# Patient Record
Sex: Male | Born: 1950 | Race: Black or African American | Hispanic: No | Marital: Married | Smoking: Never smoker
Health system: Southern US, Community
[De-identification: ages and names within clinical notes are randomized; demographics above are authoritative.]

## PROBLEM LIST (undated history)

## (undated) DIAGNOSIS — H548 Legal blindness, as defined in USA: Secondary | ICD-10-CM

## (undated) DIAGNOSIS — D649 Anemia, unspecified: Secondary | ICD-10-CM

## (undated) DIAGNOSIS — E119 Type 2 diabetes mellitus without complications: Secondary | ICD-10-CM

## (undated) DIAGNOSIS — C6290 Malignant neoplasm of unspecified testis, unspecified whether descended or undescended: Secondary | ICD-10-CM

## (undated) DIAGNOSIS — E7800 Pure hypercholesterolemia, unspecified: Secondary | ICD-10-CM

## (undated) DIAGNOSIS — I10 Essential (primary) hypertension: Secondary | ICD-10-CM

## (undated) DIAGNOSIS — K219 Gastro-esophageal reflux disease without esophagitis: Secondary | ICD-10-CM

## (undated) DIAGNOSIS — Z87442 Personal history of urinary calculi: Secondary | ICD-10-CM

## (undated) DIAGNOSIS — H409 Unspecified glaucoma: Secondary | ICD-10-CM

## (undated) HISTORY — PX: OTHER SURGICAL HISTORY: SHX169

## (undated) HISTORY — DX: Gastro-esophageal reflux disease without esophagitis: K21.9

## (undated) HISTORY — PX: BRAIN SURGERY: SHX531

## (undated) HISTORY — PX: SUBDURAL HEMATOMA EVACUATION VIA CRANIOTOMY: SUR319

## (undated) HISTORY — DX: Personal history of urinary calculi: Z87.442

## (undated) HISTORY — PX: EYE SURGERY: SHX253

---

## 2004-01-16 ENCOUNTER — Ambulatory Visit: Payer: Self-pay | Admitting: Gastroenterology

## 2006-06-14 ENCOUNTER — Other Ambulatory Visit: Payer: Self-pay

## 2006-06-14 ENCOUNTER — Emergency Department: Payer: Self-pay | Admitting: Emergency Medicine

## 2007-08-10 ENCOUNTER — Other Ambulatory Visit: Payer: Self-pay

## 2007-08-10 ENCOUNTER — Inpatient Hospital Stay: Payer: Self-pay | Admitting: Internal Medicine

## 2010-03-23 ENCOUNTER — Inpatient Hospital Stay: Payer: Self-pay | Admitting: Surgery

## 2010-03-30 LAB — PATHOLOGY REPORT

## 2010-04-23 ENCOUNTER — Ambulatory Visit: Payer: Self-pay | Admitting: Internal Medicine

## 2010-05-05 ENCOUNTER — Ambulatory Visit: Payer: Self-pay | Admitting: Internal Medicine

## 2010-06-05 ENCOUNTER — Ambulatory Visit: Payer: Self-pay | Admitting: Internal Medicine

## 2010-09-15 ENCOUNTER — Ambulatory Visit: Payer: Self-pay | Admitting: Internal Medicine

## 2010-10-05 ENCOUNTER — Ambulatory Visit: Payer: Self-pay | Admitting: Internal Medicine

## 2011-01-21 ENCOUNTER — Ambulatory Visit: Payer: Self-pay | Admitting: Internal Medicine

## 2011-01-21 LAB — CBC CANCER CENTER
Basophil #: 0 x10 3/mm (ref 0.0–0.1)
Basophil %: 0.7 %
Eosinophil #: 0.1 x10 3/mm (ref 0.0–0.7)
Eosinophil %: 1.4 %
HGB: 13 g/dL (ref 13.0–18.0)
Lymphocyte %: 23.7 %
MCH: 30.6 pg (ref 26.0–34.0)
MCHC: 32.6 g/dL (ref 32.0–36.0)
MCV: 94 fL (ref 80–100)
Monocyte #: 0.5 x10 3/mm (ref 0.0–0.7)
Monocyte %: 7.8 %
Neutrophil #: 4.3 x10 3/mm (ref 1.4–6.5)
Neutrophil %: 66.4 %
RBC: 4.25 10*6/uL — ABNORMAL LOW (ref 4.40–5.90)
RDW: 13.4 % (ref 11.5–14.5)
WBC: 6.4 x10 3/mm (ref 3.8–10.6)

## 2011-01-21 LAB — HEPATIC FUNCTION PANEL A (ARMC)
Albumin: 3.9 g/dL (ref 3.4–5.0)
Alkaline Phosphatase: 81 U/L (ref 50–136)
Bilirubin, Direct: 0.1 mg/dL (ref 0.00–0.20)
Bilirubin,Total: 0.6 mg/dL (ref 0.2–1.0)
SGPT (ALT): 35 U/L

## 2011-01-21 LAB — CREATININE, SERUM
Creatinine: 1.09 mg/dL (ref 0.60–1.30)
EGFR (African American): >60
EGFR (Non-African Amer.): >60

## 2011-02-05 ENCOUNTER — Ambulatory Visit: Payer: Self-pay | Admitting: Internal Medicine

## 2011-05-27 ENCOUNTER — Ambulatory Visit: Payer: Self-pay | Admitting: Internal Medicine

## 2011-05-27 LAB — CBC CANCER CENTER
Basophil #: 0 x10 3/mm (ref 0.0–0.1)
Basophil %: 0.5 %
Eosinophil #: 0.1 x10 3/mm (ref 0.0–0.7)
Eosinophil %: 0.8 %
HCT: 39.1 % — ABNORMAL LOW (ref 40.0–52.0)
HGB: 12.9 g/dL — ABNORMAL LOW (ref 13.0–18.0)
Lymphocyte #: 1.7 x10 3/mm (ref 1.0–3.6)
Lymphocyte %: 23.7 %
MCH: 30.6 pg (ref 26.0–34.0)
Monocyte #: 0.5 x10 3/mm (ref 0.2–1.0)
Monocyte %: 7.2 %
Neutrophil #: 5 x10 3/mm (ref 1.4–6.5)
Platelet: 229 x10 3/mm (ref 150–440)
RBC: 4.2 10*6/uL — ABNORMAL LOW (ref 4.40–5.90)

## 2011-05-27 LAB — HEPATIC FUNCTION PANEL A (ARMC)
Albumin: 3.9 g/dL (ref 3.4–5.0)
Alkaline Phosphatase: 68 U/L (ref 50–136)
Bilirubin, Direct: 0.2 mg/dL (ref 0.00–0.20)
Bilirubin,Total: 0.9 mg/dL (ref 0.2–1.0)
SGOT(AST): 18 U/L (ref 15–37)
SGPT (ALT): 35 U/L
Total Protein: 7.5 g/dL (ref 6.4–8.2)

## 2011-05-27 LAB — CREATININE, SERUM: Creatinine: 1 mg/dL (ref 0.60–1.30)

## 2011-06-05 ENCOUNTER — Ambulatory Visit: Payer: Self-pay | Admitting: Internal Medicine

## 2011-08-12 DIAGNOSIS — H2513 Age-related nuclear cataract, bilateral: Secondary | ICD-10-CM | POA: Insufficient documentation

## 2011-08-12 DIAGNOSIS — E1165 Type 2 diabetes mellitus with hyperglycemia: Secondary | ICD-10-CM | POA: Insufficient documentation

## 2011-08-12 DIAGNOSIS — H401190 Primary open-angle glaucoma, unspecified eye, stage unspecified: Secondary | ICD-10-CM | POA: Insufficient documentation

## 2011-08-12 DIAGNOSIS — H269 Unspecified cataract: Secondary | ICD-10-CM | POA: Insufficient documentation

## 2011-08-12 DIAGNOSIS — H409 Unspecified glaucoma: Secondary | ICD-10-CM | POA: Insufficient documentation

## 2011-08-12 DIAGNOSIS — I10 Essential (primary) hypertension: Secondary | ICD-10-CM | POA: Insufficient documentation

## 2011-08-12 DIAGNOSIS — H04129 Dry eye syndrome of unspecified lacrimal gland: Secondary | ICD-10-CM | POA: Insufficient documentation

## 2011-08-12 DIAGNOSIS — E1169 Type 2 diabetes mellitus with other specified complication: Secondary | ICD-10-CM | POA: Insufficient documentation

## 2012-02-03 ENCOUNTER — Ambulatory Visit: Payer: Self-pay | Admitting: Internal Medicine

## 2012-02-03 LAB — CBC CANCER CENTER
Basophil #: 0 x10 3/mm (ref 0.0–0.1)
Eosinophil #: 0.1 x10 3/mm (ref 0.0–0.7)
Eosinophil %: 1.4 %
MCH: 30.1 pg (ref 26.0–34.0)
MCHC: 33 g/dL (ref 32.0–36.0)
Monocyte #: 0.6 x10 3/mm (ref 0.2–1.0)
Monocyte %: 9 %
Neutrophil #: 4.4 x10 3/mm (ref 1.4–6.5)
Neutrophil %: 63.8 %
RBC: 4.47 10*6/uL (ref 4.40–5.90)
RDW: 14.2 % (ref 11.5–14.5)
WBC: 6.9 x10 3/mm (ref 3.8–10.6)

## 2012-02-03 LAB — HEPATIC FUNCTION PANEL A (ARMC)
Albumin: 4 g/dL (ref 3.4–5.0)
Alkaline Phosphatase: 66 U/L (ref 50–136)
Bilirubin,Total: 0.6 mg/dL (ref 0.2–1.0)
SGPT (ALT): 30 U/L (ref 12–78)

## 2012-02-03 LAB — CREATININE, SERUM
Creatinine: 1.09 mg/dL (ref 0.60–1.30)
EGFR (Non-African Amer.): >60

## 2012-02-05 ENCOUNTER — Ambulatory Visit: Payer: Self-pay | Admitting: Internal Medicine

## 2012-03-04 ENCOUNTER — Ambulatory Visit: Payer: Self-pay | Admitting: Internal Medicine

## 2012-04-04 ENCOUNTER — Ambulatory Visit: Payer: Self-pay | Admitting: Internal Medicine

## 2013-03-08 ENCOUNTER — Ambulatory Visit: Payer: Self-pay | Admitting: Internal Medicine

## 2013-03-08 LAB — CREATININE, SERUM
Creatinine: 1.03 mg/dL (ref 0.60–1.30)
EGFR (Non-African Amer.): >60

## 2013-03-08 LAB — CBC CANCER CENTER
BASOS PCT: 0.7 %
Basophil #: 0 x10 3/mm (ref 0.0–0.1)
EOS PCT: 1.8 %
Eosinophil #: 0.1 x10 3/mm (ref 0.0–0.7)
HCT: 40 % (ref 40.0–52.0)
HGB: 12.9 g/dL — ABNORMAL LOW (ref 13.0–18.0)
Lymphocyte #: 1.5 x10 3/mm (ref 1.0–3.6)
Lymphocyte %: 22 %
MCH: 30 pg (ref 26.0–34.0)
MCHC: 32.3 g/dL (ref 32.0–36.0)
MCV: 93 fL (ref 80–100)
MONO ABS: 0.5 x10 3/mm (ref 0.2–1.0)
Monocyte %: 6.9 %
Neutrophil #: 4.6 x10 3/mm (ref 1.4–6.5)
Neutrophil %: 68.6 %
PLATELETS: 274 x10 3/mm (ref 150–440)
RBC: 4.31 10*6/uL — AB (ref 4.40–5.90)
RDW: 14.4 % (ref 11.5–14.5)
WBC: 6.7 x10 3/mm (ref 3.8–10.6)

## 2013-03-08 LAB — HEPATIC FUNCTION PANEL A (ARMC)
ALK PHOS: 73 U/L
Albumin: 4 g/dL (ref 3.4–5.0)
Bilirubin, Direct: 0.2 mg/dL (ref 0.00–0.20)
Bilirubin,Total: 0.6 mg/dL (ref 0.2–1.0)
SGOT(AST): 14 U/L — ABNORMAL LOW (ref 15–37)
SGPT (ALT): 28 U/L (ref 12–78)
Total Protein: 7.8 g/dL (ref 6.4–8.2)

## 2013-04-04 ENCOUNTER — Ambulatory Visit: Payer: Self-pay | Admitting: Internal Medicine

## 2013-06-13 DIAGNOSIS — E7800 Pure hypercholesterolemia, unspecified: Secondary | ICD-10-CM | POA: Insufficient documentation

## 2013-09-13 ENCOUNTER — Ambulatory Visit: Payer: Self-pay | Admitting: Internal Medicine

## 2014-05-20 ENCOUNTER — Emergency Department: Payer: Commercial Managed Care - HMO

## 2014-05-20 ENCOUNTER — Encounter: Payer: Self-pay | Admitting: Emergency Medicine

## 2014-05-20 ENCOUNTER — Emergency Department
Admission: EM | Admit: 2014-05-20 | Discharge: 2014-05-20 | Disposition: A | Discharge status: Home / Self Care | Payer: Commercial Managed Care - HMO | Attending: Emergency Medicine | Admitting: Emergency Medicine

## 2014-05-20 DIAGNOSIS — Y92410 Unspecified street and highway as the place of occurrence of the external cause: Secondary | ICD-10-CM | POA: Diagnosis not present

## 2014-05-20 DIAGNOSIS — Y998 Other external cause status: Secondary | ICD-10-CM | POA: Insufficient documentation

## 2014-05-20 DIAGNOSIS — E119 Type 2 diabetes mellitus without complications: Secondary | ICD-10-CM | POA: Diagnosis not present

## 2014-05-20 DIAGNOSIS — S161XXA Strain of muscle, fascia and tendon at neck level, initial encounter: Secondary | ICD-10-CM | POA: Insufficient documentation

## 2014-05-20 DIAGNOSIS — I10 Essential (primary) hypertension: Secondary | ICD-10-CM | POA: Diagnosis not present

## 2014-05-20 DIAGNOSIS — Y9389 Activity, other specified: Secondary | ICD-10-CM | POA: Insufficient documentation

## 2014-05-20 DIAGNOSIS — S199XXA Unspecified injury of neck, initial encounter: Secondary | ICD-10-CM | POA: Diagnosis present

## 2014-05-20 HISTORY — DX: Unspecified glaucoma: H40.9

## 2014-05-20 HISTORY — DX: Type 2 diabetes mellitus without complications: E11.9

## 2014-05-20 HISTORY — DX: Essential (primary) hypertension: I10

## 2014-05-20 HISTORY — DX: Malignant neoplasm of unspecified testis, unspecified whether descended or undescended: C62.90

## 2014-05-20 HISTORY — DX: Legal blindness, as defined in USA: H54.8

## 2014-05-20 HISTORY — DX: Pure hypercholesterolemia, unspecified: E78.00

## 2014-05-20 LAB — GLUCOSE, CAPILLARY: Glucose-Capillary: 139 mg/dL — ABNORMAL HIGH (ref 65–99)

## 2014-05-20 MED ORDER — HYDROCODONE-ACETAMINOPHEN 5-325 MG PO TABS: 1.0000 | ORAL_TABLET | ORAL | Status: DC | PRN
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
## 2014-05-20 NOTE — Discharge Instructions (Signed)
Cervical Sprain A cervical sprain is when the tissues (ligaments) that hold the neck bones in place stretch or tear. HOME CARE   Put ice on the injured area.  Put ice in a plastic bag.  Place a towel between your skin and the bag.  Leave the ice on for 15-20 minutes, 3-4 times a day.  You may have been given a collar to wear. This collar keeps your neck from moving while you heal.  Do not take the collar off unless told by your doctor.  If you have long hair, keep it outside of the collar.  Ask your doctor before changing the position of your collar. You may need to change its position over time to make it more comfortable.  If you are allowed to take off the collar for cleaning or bathing, follow your doctor's instructions on how to do it safely.  Keep your collar clean by wiping it with mild soap and water. Dry it completely. If the collar has removable pads, remove them every 1-2 days to hand wash them with soap and water. Allow them to air dry. They should be dry before you wear them in the collar.  Do not drive while wearing the collar.  Only take medicine as told by your doctor.  Keep all doctor visits as told.  Keep all physical therapy visits as told.  Adjust your work station so that you have good posture while you work.  Avoid positions and activities that make your problems worse.  Warm up and stretch before being active. GET HELP IF:  Your pain is not controlled with medicine.  You cannot take less pain medicine over time as planned.  Your activity level does not improve as expected. GET HELP RIGHT AWAY IF:   You are bleeding.  Your stomach is upset.  You have an allergic reaction to your medicine.  You develop new problems that you cannot explain.  You lose feeling (become numb) or you cannot move any part of your body (paralysis).  You have tingling or weakness in any part of your body.  Your symptoms get worse. Symptoms include:  Pain,  soreness, stiffness, puffiness (swelling), or a burning feeling in your neck.  Pain when your neck is touched.  Shoulder or upper back pain.  Limited ability to move your neck.  Headache.  Dizziness.  Your hands or arms feel week, lose feeling, or tingle.  Muscle spasms.  Difficulty swallowing or chewing. MAKE SURE YOU:   Understand these instructions.  Will watch your condition.  Will get help right away if you are not doing well or get worse. Document Released: 06/09/2007 Document Revised: 08/23/2012 Document Reviewed: 06/28/2012 ExitCare Patient Information 2015 [employer]. This information is not intended to replace advice given to you by your health care provider. Make sure you discuss any questions you have with your health care provider.  

## 2015-06-30 DIAGNOSIS — Z862 Personal history of diseases of the blood and blood-forming organs and certain disorders involving the immune mechanism: Secondary | ICD-10-CM | POA: Insufficient documentation

## 2015-06-30 DIAGNOSIS — Z7189 Other specified counseling: Secondary | ICD-10-CM | POA: Insufficient documentation

## 2015-06-30 DIAGNOSIS — Z7185 Encounter for immunization safety counseling: Secondary | ICD-10-CM | POA: Insufficient documentation

## 2016-04-12 ENCOUNTER — Encounter: Payer: Self-pay | Admitting: *Deleted

## 2016-04-13 ENCOUNTER — Ambulatory Visit: Payer: Medicare HMO | Admitting: Anesthesiology

## 2016-04-13 ENCOUNTER — Ambulatory Visit
Admission: RE | Admit: 2016-04-13 | Discharge: 2016-04-13 | Disposition: A | Discharge status: Home / Self Care | Payer: Medicare HMO | Source: Ambulatory Visit | Attending: Gastroenterology | Admitting: Gastroenterology

## 2016-04-13 ENCOUNTER — Encounter: Payer: Self-pay | Admitting: *Deleted

## 2016-04-13 ENCOUNTER — Encounter
Admission: RE | Disposition: A | Discharge status: Home / Self Care | Payer: Self-pay | Source: Ambulatory Visit | Attending: Gastroenterology

## 2016-04-13 DIAGNOSIS — D128 Benign neoplasm of rectum: Secondary | ICD-10-CM | POA: Diagnosis not present

## 2016-04-13 DIAGNOSIS — Z7984 Long term (current) use of oral hypoglycemic drugs: Secondary | ICD-10-CM | POA: Insufficient documentation

## 2016-04-13 DIAGNOSIS — Z8547 Personal history of malignant neoplasm of testis: Secondary | ICD-10-CM | POA: Diagnosis not present

## 2016-04-13 DIAGNOSIS — E7800 Pure hypercholesterolemia, unspecified: Secondary | ICD-10-CM | POA: Insufficient documentation

## 2016-04-13 DIAGNOSIS — E119 Type 2 diabetes mellitus without complications: Secondary | ICD-10-CM | POA: Insufficient documentation

## 2016-04-13 DIAGNOSIS — Z1211 Encounter for screening for malignant neoplasm of colon: Secondary | ICD-10-CM | POA: Diagnosis present

## 2016-04-13 DIAGNOSIS — H409 Unspecified glaucoma: Secondary | ICD-10-CM | POA: Insufficient documentation

## 2016-04-13 DIAGNOSIS — I10 Essential (primary) hypertension: Secondary | ICD-10-CM | POA: Diagnosis not present

## 2016-04-13 DIAGNOSIS — Z79899 Other long term (current) drug therapy: Secondary | ICD-10-CM | POA: Diagnosis not present

## 2016-04-13 DIAGNOSIS — Z7982 Long term (current) use of aspirin: Secondary | ICD-10-CM | POA: Insufficient documentation

## 2016-04-13 DIAGNOSIS — Z85060 Personal history of malignant carcinoid tumor of small intestine: Secondary | ICD-10-CM | POA: Diagnosis not present

## 2016-04-13 DIAGNOSIS — H5461 Unqualified visual loss, right eye, normal vision left eye: Secondary | ICD-10-CM | POA: Insufficient documentation

## 2016-04-13 HISTORY — DX: Anemia, unspecified: D64.9

## 2016-04-13 HISTORY — PX: COLONOSCOPY WITH PROPOFOL: SHX5780

## 2016-04-13 SURGERY — COLONOSCOPY WITH PROPOFOL
Anesthesia: General

## 2016-04-13 MED ORDER — MIDAZOLAM HCL 2 MG/2ML IJ SOLN
INTRAMUSCULAR | Status: AC
  Filled 2016-04-13: qty 2

## 2016-04-13 MED ORDER — PROPOFOL 500 MG/50ML IV EMUL
INTRAVENOUS | Status: DC | PRN
  Administered 2016-04-13: 150 ug/kg/min via INTRAVENOUS

## 2016-04-13 MED ORDER — PROPOFOL 10 MG/ML IV BOLUS
INTRAVENOUS | Status: AC
Start: 2016-04-13 — End: 2016-04-13
  Filled 2016-04-13: qty 20

## 2016-04-13 MED ORDER — LIDOCAINE HCL (PF) 2 % IJ SOLN
INTRAMUSCULAR | Status: AC
  Filled 2016-04-13: qty 2

## 2016-04-13 MED ORDER — SODIUM CHLORIDE 0.9 % IV SOLN
INTRAVENOUS | Status: DC
  Administered 2016-04-13 (×2): via INTRAVENOUS

## 2016-04-13 MED ORDER — PROPOFOL 10 MG/ML IV BOLUS
INTRAVENOUS | Status: DC | PRN
  Administered 2016-04-13: 40 mg via INTRAVENOUS

## 2016-04-13 MED ORDER — PROPOFOL 500 MG/50ML IV EMUL
INTRAVENOUS | Status: AC
  Filled 2016-04-13: qty 50

## 2016-04-13 MED ORDER — FENTANYL CITRATE (PF) 100 MCG/2ML IJ SOLN
INTRAMUSCULAR | Status: AC
  Filled 2016-04-13: qty 2

## 2016-04-13 MED ORDER — EPHEDRINE SULFATE 50 MG/ML IJ SOLN
INTRAMUSCULAR | Status: DC | PRN
  Administered 2016-04-13: 10 mg via INTRAVENOUS

## 2016-04-13 MED ORDER — FENTANYL CITRATE (PF) 100 MCG/2ML IJ SOLN
INTRAMUSCULAR | Status: DC | PRN
  Administered 2016-04-13: 50 ug via INTRAVENOUS

## 2016-04-13 MED ORDER — SODIUM CHLORIDE 0.9 % IV SOLN: INTRAVENOUS | Status: DC

## 2016-04-13 MED ORDER — EPHEDRINE SULFATE 50 MG/ML IJ SOLN
INTRAMUSCULAR | Status: AC
  Filled 2016-04-13: qty 1

## 2016-04-13 MED ORDER — MIDAZOLAM HCL 2 MG/2ML IJ SOLN
INTRAMUSCULAR | Status: DC | PRN
  Administered 2016-04-13: 2 mg via INTRAVENOUS

## 2016-04-13 NOTE — Anesthesia Procedure Notes (Signed)
Date/Time: 04/13/2016 2:00 PM Performed by: Allean Found Pre-anesthesia Checklist: Patient identified, Emergency Drugs available, Suction available, Patient being monitored and Timeout performed Patient Re-evaluated:Patient Re-evaluated prior to inductionOxygen Delivery Method: Nasal cannula Intubation Type: IV induction

## 2016-04-13 NOTE — Anesthesia Procedure Notes (Signed)
Performed by: Iyesha Such       

## 2016-04-13 NOTE — H&P (Signed)
Outpatient short stay form Pre-procedure 04/13/2016 1:53 PM Lollie Sails MD  Primary Physician: Dr Dion Body  Reason for visit:  Colonoscopy  History of present illness:  Patient is a 66 year old male presenting today as above. Had a colonoscopy over 10 years ago. There were no findings on that. 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    Current Facility-Administered Medications:  .  0.9 %  sodium chloride infusion, , Intravenous, Continuous, Lollie Sails, MD, Last Rate: 20 mL/hr at 04/13/16 1212 .  0.9 %  sodium chloride infusion, , Intravenous, Continuous, Lollie Sails, MD  Prescriptions Prior to Admission  Medication Sig Dispense Refill Last Dose  . amLODipine-atorvastatin (CADUET) 5-10 MG tablet Take 1 tablet by mouth daily.   04/13/2016 at Unknown time  . aspirin EC 81 MG tablet Take 81 mg by mouth daily.   Past Week at Unknown time  . brimonidine (ALPHAGAN) 0.2 % ophthalmic solution Place into both eyes 2 (two) times daily.   04/13/2016 at Unknown time  . brimonidine-timolol (COMBIGAN) 0.2-0.5 % ophthalmic solution Place 1 drop into both eyes 2 (two) times daily.   04/13/2016 at Unknown time  . glipiZIDE (GLUCOTROL) 10 MG tablet Take 20 mg by mouth 2 (two) times daily with a meal.   Past Week at Unknown time  . latanoprost (XALATAN) 0.005 % ophthalmic solution Place 1 drop into both eyes at bedtime.   04/13/2016 at Unknown time  . metFORMIN (GLUCOPHAGE) 1000 MG tablet Take 1,000 mg by mouth 2 (two) times daily with a meal.   Past Week at Unknown time  . Multiple Vitamin (MULTIVITAMIN) tablet Take 1 tablet by mouth daily.   Past Week at Unknown time  . ramipril (ALTACE) 5 MG capsule Take 5 mg by mouth daily.   Past Week at Unknown time  . sildenafil (VIAGRA) 100 MG tablet Take 100 mg by mouth daily as needed for erectile dysfunction.   Past Week at Unknown time  .  HYDROcodone-acetaminophen (NORCO/VICODIN) 5-325 MG per tablet Take 1 tablet by mouth every 4 (four) hours as needed for moderate pain. (Patient not taking: Reported on 04/13/2016) 20 tablet 0 Not Taking     No Known Allergies   Past Medical History:  Diagnosis Date  . Anemia   . Diabetes mellitus without complication ([place])   . Glaucoma   . Hypercholesterolemia   . Hypertension   . Legally blind in right eye, as defined in [place]   . Testicle cancer [facility])     Review of systems:      Physical Exam    Heart and lungs: Regular rate and rhythm without rub or gallop, lungs are bilaterally clear.    HEENT: Normocephalic atraumatic eyes are anicteric    Other:     Pertinant exam for procedure: Soft nontender nondistended bowel sounds positive normoactive I have discussed the risks benefits and complications of procedures to include not limited to bleeding, infection, perforation and the risk of sedation and the patient wishes to proceed.    Planned proceedures: Colonoscopy and indicated procedures. I have discussed the risks benefits and complications of procedures to include not limited to bleeding, infection, perforation and the risk of sedation and the patient wishes to proceed.    Lollie Sails, MD Gastroenterology 04/13/2016  1:53 PM

## 2016-04-13 NOTE — Anesthesia Post-op Follow-up Note (Cosign Needed)
Anesthesia QCDR form completed.        

## 2016-04-13 NOTE — Anesthesia Preprocedure Evaluation (Signed)
Anesthesia Evaluation  Patient identified by MRN, date of birth, ID band Patient awake    Reviewed: Allergy & Precautions, H&P , NPO status , Patient's Chart, lab work & pertinent test results  History of Anesthesia Complications Negative for: history of anesthetic complications  Airway Mallampati: III  TM Distance: >3 FB Neck ROM: limited    Dental  (+) Poor Dentition, Upper Dentures, Lower Dentures, Missing   Pulmonary neg pulmonary ROS, neg shortness of breath,    Pulmonary exam normal breath sounds clear to auscultation       Cardiovascular Exercise Tolerance: Good hypertension, (-) angina(-) Past MI and (-) DOE Normal cardiovascular exam Rhythm:regular Rate:Normal     Neuro/Psych negative neurological ROS  negative psych ROS   GI/Hepatic negative GI ROS, Neg liver ROS,   Endo/Other  diabetes, Type 2  Renal/GU negative Renal ROS  negative genitourinary   Musculoskeletal   Abdominal   Peds  Hematology negative hematology ROS (+)   Anesthesia Other Findings Signs and symptoms suggestive of sleep apnea   Past Medical History: No date: Anemia No date: Diabetes mellitus without complication (HCC) No date: Glaucoma No date: Hypercholesterolemia No date: Hypertension No date: Legally blind in right eye, as defined in [place] No date: Testicle cancer ([place])  Past Surgical History: No date: EYE SURGERY No date: teticle surgery  BMI    Body Mass Index:  23.76 kg/m      Reproductive/Obstetrics negative OB ROS                             Anesthesia Physical Anesthesia Plan  ASA: III  Anesthesia Plan: General   Post-op Pain Management:    Induction:   Airway Management Planned:   Additional Equipment:   Intra-op Plan:   Post-operative Plan:   Informed Consent: I have reviewed the patients History and Physical, chart, labs and discussed the procedure including the risks,  benefits and alternatives for the proposed anesthesia with the patient or authorized representative who has indicated his/her understanding and acceptance.   Dental Advisory Given  Plan Discussed with: Anesthesiologist, CRNA and Surgeon  Anesthesia Plan Comments:         Anesthesia Quick Evaluation

## 2016-04-13 NOTE — Op Note (Signed)
[facility] Gastroenterology Patient Name: Dale Holt Procedure Date: 04/13/2016 1:33 PM MRN: [id number] Account #: [id number] Date of Birth: [birth year] Admit Type: Outpatient Age: 66 Room: [facility] ENDO ROOM 3 Gender: Male Note Status: Finalized Procedure:            Colonoscopy Indications:          Screening for colorectal malignant neoplasm Providers:            Lollie Sails, MD Referring MD:         No Local Md, MD (Referring MD) Medicines:            Monitored Anesthesia Care Complications:        No immediate complications. Procedure:            Pre-Anesthesia Assessment:                       - ASA Grade Assessment: III - A patient with severe                        systemic disease.                       After obtaining informed consent, the colonoscope was                        passed under direct vision. Throughout the procedure,                        the patient's blood pressure, pulse, and oxygen                        saturations were monitored continuously. The                        Colonoscope was introduced through the anus and                        advanced to the the cecum, identified by appendiceal                        orifice and ileocecal valve. The colonoscopy was                        performed with moderate difficulty. The patient                        tolerated the procedure well. The quality of the bowel                        preparation was fair. Findings:      A 3 mm polyp was found in the rectum. The polyp was sessile. The polyp       was removed with a cold snare. Resection and retrieval were complete.      The retroflexed view of the distal rectum and anal verge was normal and       showed no anal or rectal abnormalities.      The digital rectal exam was normal. Impression:           - Preparation of the colon was fair.                       -  One 3 mm polyp in the rectum, removed with a cold   snare. Resected and retrieved.                       - The distal rectum and anal verge are normal on                        retroflexion view. Recommendation:       - Discharge patient to home.                       - Telephone GI clinic for pathology results in 1 week. Procedure Code(s):    --- Professional ---                       [phone], Colonoscopy, flexible; with removal of tumor(s),                        polyp(s), or other lesion(s) by snare technique Diagnosis Code(s):    --- Professional ---                       Z12.11, Encounter for screening for malignant neoplasm                        of colon                       K62.1, Rectal polyp CPT copyright 2016 American Medical Association. All rights reserved. The codes documented in this report are preliminary and upon coder review may  be revised to meet current compliance requirements. Lollie Sails, MD 04/13/2016 2:40:57 PM This report has been signed electronically. Number of Addenda: 0 Note Initiated On: 04/13/2016 1:33 PM Scope Withdrawal Time: 0 hours 7 minutes 20 seconds  Total Procedure Duration: 0 hours 24 minutes 37 seconds       [facility]

## 2016-04-13 NOTE — Transfer of Care (Signed)
Immediate Anesthesia Transfer of Care Note  Patient: Dale Holt  Procedure(s) Performed: Procedure(s): COLONOSCOPY WITH PROPOFOL (N/A)  Patient Location: PACU  Anesthesia Type:General  Level of Consciousness: sedated  Airway & Oxygen Therapy: Patient Spontanous Breathing and Patient connected to nasal cannula oxygen  Post-op Assessment: Report given to RN and Post -op Vital signs reviewed and stable  Post vital signs: Reviewed and stable  Last Vitals:  Vitals:   04/13/16 1145 04/13/16 1449  BP: (!) 142/84 (P) 98/69  Pulse: 73 (P) 80  Resp: 16 (P) 16  Temp:  (P) 36.1 C    Last Pain:  Vitals:   04/13/16 1449  TempSrc: (P) Tympanic         Complications: No apparent anesthesia complications

## 2016-04-13 NOTE — Anesthesia Postprocedure Evaluation (Signed)
Anesthesia Post Note  Patient: Dale Holt  Procedure(s) Performed: Procedure(s) (LRB): COLONOSCOPY WITH PROPOFOL (N/A)  Patient location during evaluation: Endoscopy Anesthesia Type: General Level of consciousness: awake and alert and oriented Pain management: pain level controlled Vital Signs Assessment: post-procedure vital signs reviewed and stable Respiratory status: spontaneous breathing, nonlabored ventilation and respiratory function stable Cardiovascular status: blood pressure returned to baseline and stable Postop Assessment: no signs of nausea or vomiting Anesthetic complications: no     Last Vitals:  Vitals:   04/13/16 1449 04/13/16 1459  BP: 98/69 105/73  Pulse: 80 77  Resp: 16 13  Temp: 36.1 C     Last Pain:  Vitals:   04/13/16 1449  TempSrc: Tympanic                 Candice Lunney

## 2016-04-15 LAB — SURGICAL PATHOLOGY

## 2016-08-11 DIAGNOSIS — R972 Elevated prostate specific antigen [PSA]: Secondary | ICD-10-CM | POA: Insufficient documentation

## 2016-08-11 DIAGNOSIS — Z0000 Encounter for general adult medical examination without abnormal findings: Secondary | ICD-10-CM | POA: Insufficient documentation

## 2018-02-01 ENCOUNTER — Encounter: Payer: Self-pay | Admitting: Urology

## 2018-02-01 ENCOUNTER — Ambulatory Visit: Payer: Medicare HMO | Admitting: Urology

## 2018-02-01 VITALS — BP 169/90 | HR 85 | Ht 70.0 in | Wt 155.0 lb

## 2018-02-01 DIAGNOSIS — R972 Elevated prostate specific antigen [PSA]: Secondary | ICD-10-CM

## 2018-02-01 DIAGNOSIS — Z961 Presence of intraocular lens: Secondary | ICD-10-CM | POA: Insufficient documentation

## 2018-02-01 LAB — BLADDER SCAN AMB NON-IMAGING

## 2018-02-01 NOTE — Progress Notes (Signed)
02/01/2018 9:30 AM   Karle Barr [birth year] [id number]  Referring provider: Katheren Shams [place], [place] [address]  CC: Elevated PSA  HPI: I saw Mr. Dohrmann in urology clinic today in consultation for elevated PSA from Dr. Netty Starring.  He is a 68 year old African-American male with past medical history notable for insulin-dependent diabetes, erectile dysfunction refractory to medications, hypertension, and glaucoma. His past surgical history is notable for exploratory laparotomy with bowel resection for reportedly benign intestinal lesion.  He denies a family history of prostate cancer.  On chart review in care everywhere, he has had a persistent rise in his PSA, with most recent value of 6.8 in December 2019.  He does have a history of a reportedly negative prostate biopsy approximately 10 years ago in [place].  He is unsure what his PSA was at the time of this biopsy, or the provider, and these records are unavailable to me currently.  He denies any significant urinary symptoms, and PVR in clinic is 98 cc.  He denies gross hematuria, weight loss, or bone pain.  There are no aggravating or alleviating factors.  Severity is moderate.   PMH: Past Medical History:  Diagnosis Date  . Anemia   . Diabetes mellitus without complication ([place])   . Glaucoma   . Hypercholesterolemia   . Hypertension   . Legally blind in right eye, as defined in [place]   . Testicle cancer [facility])     Surgical History: Past Surgical History:  Procedure Laterality Date  . COLONOSCOPY WITH PROPOFOL N/A 04/13/2016   Procedure: COLONOSCOPY WITH PROPOFOL;  Surgeon: Lollie Sails, MD;  Location: [facility] ENDOSCOPY;  Service: Endoscopy;  Laterality: N/A;  . EYE SURGERY    . teticle surgery      Allergies: No Known Allergies  Family History: No family history on file.  Social History:  reports that he has never smoked. He has never used smokeless tobacco. He reports current  alcohol use. No history on file for drug.  ROS: Please see flowsheet from today's date for complete review of systems.  Physical Exam: BP (!) 169/90   Pulse 85   Ht 5\' 10"  (1.778 m)   Wt 155 lb (70.3 kg)   BMI 22.24 kg/m    Constitutional:  Alert and oriented, No acute distress. Cardiovascular: No clubbing, cyanosis, or edema. Respiratory: Normal respiratory effort, no increased work of breathing. GI: Abdomen is soft, nontender, nondistended, no abdominal masses GU: No CVA tenderness DRE: 30 g, smooth, no nodules Lymph: No cervical or inguinal lymphadenopathy. Skin: No rashes, bruises or suspicious lesions. Neurologic: Grossly intact, no focal deficits, moving all 4 extremities. Psychiatric: Normal mood and affect.  Laboratory Data: PSA history 12/2017: 6.8 06/2017: 5.85 12/2016: 5.22 08/2016: 5.67 06/2015: 4.4 02/2015: 4.98  Pertinent Imaging: None to review  Assessment & Plan:    1. Elevated PSA In summary, the patient is a 68 year old African-American male with no family history of prostate cancer and steady rise in his PSA with most recent value of 6.8.  He has a history of a negative prostate biopsy 10 years ago.  Those results are unavailable, and he is unsure what his PSA was at that time.  We reviewed the implications of an elevated PSA and the uncertainty surrounding it. In general, a man's PSA increases with age and is produced by both normal and cancerous prostate tissue. The differential diagnosis for elevated PSA includes BPH, prostate cancer, infection, recent intercourse/ejaculation, recent urethroscopic manipulation (foley placement/cystoscopy)  or trauma, and prostatitis.   Management of an elevated PSA can include observation or prostate biopsy and we discussed this in detail. Our goal is to detect clinically significant prostate cancers, and manage with either active surveillance, surgery, or radiation for localized disease. Risks of prostate biopsy include  bleeding, infection (including life threatening sepsis), pain, and lower urinary symptoms. Hematuria, hematospermia, and blood in the stool are all common after biopsy and can persist up to 4 weeks.   We discussed options moving forward including immediate prostate biopsy, repeat PSA with free to total ratio, or 4K score.  He would like to proceed with a 4K score to better stratify risk of clinically significant prostate cancer and decision to proceed with prostate biopsy.  We will try to obtain his prior prostate biopsy results from around 10 years ago.  RTC 4 weeks to discuss 4K score, and prior prostate biopsy results  Billey Co, MD  [place] [address], [place], [place] [address] [phone]

## 2018-02-01 NOTE — Patient Instructions (Addendum)
Prostate Cancer Screening  The prostate is a walnut-sized gland that is located below the bladder and in front of the rectum in males. The function of the prostate (prostate gland) is to add fluid to semen during ejaculation. Prostate cancer is the second most common type of cancer in men. A screening test for cancer is a test that is done before cancer symptoms start. Screening can help to identify cancer at an early stage, when the cancer can be treated more easily. The recommended prostate cancer screening test is a blood test called the prostate-specific antigen (PSA) test. PSA is a protein that is made in the prostate. As you age, your prostate naturally produces more PSA. Abnormally high PSA levels may be caused by:  Prostate cancer.  An enlarged prostate that is not caused by cancer (benign prostatic hyperplasia, BPH). This condition is very common in older men.  A prostate gland infection (prostatitis).  Medicines to assist with hair growth, such as finasteride. Depending on the PSA results, you may need more tests, such as:  A physical exam to check the size of your prostate gland.  Blood and imaging tests.  A procedure to remove tissue samples from your prostate gland for testing (biopsy). Who should have screening? Screening recommendations vary based on age.  If you are younger than age 70, screening is not recommended.  If you are age 70-54 and you have no risk factors, screening is not recommended.  If you are younger than age 70, ask your health care provider if you need screening if you have one of these risk factors: ? Being of African-American descent. ? Having a family history of prostate cancer.  If you are age 70-70, talk with your health care provider about your need for screening and how often screening should be done.  If you are older than age 70, screening is not recommended. This is because the risks that screening can cause are greater than the benefits  that it may provide (risks outweigh the benefits). If you are at high risk for prostate cancer, your health care provider may recommend that you have screenings more often or start screening at a younger age. You may be at high risk if you:  Are older than age 70.  Are African-American.  Have a father, brother, or uncle who has been diagnosed with prostate cancer. The risk may be higher if your family member's cancer occurred at an early age. What are the benefits of screening? There is a small chance that screening may lower your risk of dying from prostate cancer. The chance is small because prostate cancer is typically a slow-growing cancer, and most men with prostate cancer die from a different cause. What are the risks of screening? The main risk of prostate cancer screening is diagnosing and treating prostate cancer that would never have caused any symptoms or problems (overdiagnosis and overtreatment). PSA screening cannot tell you if your PSA is high due to cancer or a different cause. A prostate biopsy is the only procedure to diagnose prostate cancer. Even the results of a biopsy may not tell you if your cancer needs to be treated. Slow-growing prostate cancer may not need any treatment other than monitoring, so diagnosing and treating it may cause unnecessary stress or other side effects. A prostate biopsy may also cause:  Infection or fever.  A false negative. This is a result that shows that you do not have prostate cancer when you actually do have prostate cancer.  Questions to ask your health care provider  When should I start prostate cancer screening?  What is my risk for prostate cancer?  How often do I need screening?  What type of screening tests do I need?  How do I get my test results?  What do my results mean?  Do I need treatment? Contact a health care provider if:  You have difficulty urinating.  You have pain when you urinate or ejaculate.  You have  blood in your urine or semen.  You have pain in your back or in the area of your prostate.  You have trouble getting or maintaining an erection (erectile dysfunction, ED). Summary  Prostate cancer is a common type of cancer in men. The prostate (prostate gland) is located below the bladder and in front of the rectum. This gland adds fluid to semen during ejaculation.  Prostate cancer screening may identify cancer at an early stage, when the cancer can be treated more easily.  The prostate-specific antigen (PSA) test is the recommended screening test for prostate cancer.  Discuss the risks and benefits of prostate cancer screening with your health care provider. If you are age 70 or older, screening is likely to lead to more risks than benefits (risks outweigh the benefits). This information is not intended to replace advice given to you by your health care provider. Make sure you discuss any questions you have with your health care provider. Document Released: 10/01/2016 Document Revised: 10/01/2016 Document Reviewed: 10/01/2016 Elsevier Interactive Patient Education  2019 [employer].   We will contact you with your lab results.  Prostate Biopsy Instructions  Stop all aspirin or blood thinners (aspirin, plavix, coumadin, warfarin, motrin, ibuprofen, advil, aleve, naproxen, naprosyn) for 7 days prior to the procedure.  If you have any questions about stopping these medications, please contact your primary care physician or cardiologist.  Having a light meal prior to the procedure is recommended.  If you are diabetic or have low blood sugar please bring a small snack or glucose tablet.  A Fleets enema is needed to be purchased over the counter at a local pharmacy and used 2 hours before you scheduled appointment.  This can be purchased over the counter at any pharmacy.  Antibiotics will be administered in the clinic at the time of the procedure unless otherwise specified.    Please  bring someone with you to the procedure to drive you home.  A follow up appointment has been scheduled for you to receive the results of the biopsy.  If you have any questions or concerns, please feel free to call the office at [phone] or send a Mychart message.    Thank you, Staff at [facility]

## 2018-02-03 ENCOUNTER — Other Ambulatory Visit: Payer: Self-pay | Admitting: Urology

## 2018-03-07 ENCOUNTER — Ambulatory Visit: Payer: Medicare HMO | Admitting: Urology

## 2018-03-07 ENCOUNTER — Encounter: Payer: Self-pay | Admitting: Urology

## 2018-03-07 VITALS — BP 154/82 | HR 103 | Ht 70.0 in | Wt 157.0 lb

## 2018-03-07 DIAGNOSIS — R972 Elevated prostate specific antigen [PSA]: Secondary | ICD-10-CM

## 2018-03-07 NOTE — Patient Instructions (Signed)

## 2018-03-07 NOTE — Progress Notes (Signed)
   03/07/2018 11:07 AM   Dale Holt 01/18/1950 [id number]  Reason for visit: Follow up elevated PSA  HPI: I saw Dale Holt back in urology clinic to discuss his elevated PSA.  To briefly summarize, he is a 68 year old African-American male with insulin-dependent diabetes, erectile dysfunction refractory to medications, and hypertension who also has a history of exploratory laparotomy and bowel resection.  He has no family history of prostate cancer.  He has had a persistent rise in his PSA with most recent value of 6.8 in December 2019.  He did have a negative prostate biopsy approximately 10 years ago, and the results are unavailable to me.  He had elected to move forward with a 4K score. This returned with a 10% risk of clinically significant prostate cancer.  We again discussed options moving forward including observation, discontinuing PSA screening, proceeding with transrectal ultrasound guided prostate biopsy, or prostate MRI to evaluate for suspicious lesions.  He would like to avoid a biopsy if possible, and would like to proceed with prostate MRI.  Obtain prostate MRI, if negative consider discontinuing PSA screening, if suspicious lesion seen we will arrange fusion biopsy in [facility] with [facility]  A total of 15 minutes were spent face-to-face with the patient, greater than 50% was spent in patient education, counseling, and coordination of care regarding PSA screening and options for management.   Billey Co, [facility] [address], [place], [place] [address] [phone]

## 2018-05-01 ENCOUNTER — Ambulatory Visit
Admission: RE | Admit: 2018-05-01 | Discharge: 2018-05-01 | Disposition: A | Discharge status: Home / Self Care | Payer: Medicare HMO | Source: Ambulatory Visit | Attending: Urology | Admitting: Urology

## 2018-05-01 ENCOUNTER — Other Ambulatory Visit: Payer: Self-pay

## 2018-05-01 DIAGNOSIS — R972 Elevated prostate specific antigen [PSA]: Secondary | ICD-10-CM | POA: Diagnosis present

## 2018-05-01 LAB — POCT I-STAT CREATININE: Creatinine, Ser: 0.9 mg/dL (ref 0.61–1.24)

## 2018-05-01 MED ORDER — GADOBUTROL 1 MMOL/ML IV SOLN
7.0000 mL | Freq: Once | INTRAVENOUS | Status: AC | PRN
  Administered 2018-05-01: 10:00:00 7 mL via INTRAVENOUS

## 2018-05-04 ENCOUNTER — Telehealth: Payer: Self-pay | Admitting: Urology

## 2018-05-04 NOTE — Telephone Encounter (Signed)
UROLOGY TELEPHONE NOTE  Unable to reach patient by phone today to discuss prostate MRI results.  Will try again early next week.  Nickolas Madrid, MD 05/04/2018

## 2018-05-16 ENCOUNTER — Other Ambulatory Visit: Payer: Self-pay

## 2018-05-16 ENCOUNTER — Telehealth (INDEPENDENT_AMBULATORY_CARE_PROVIDER_SITE_OTHER): Payer: Medicare HMO | Admitting: Urology

## 2018-05-16 DIAGNOSIS — R972 Elevated prostate specific antigen [PSA]: Secondary | ICD-10-CM

## 2018-05-16 NOTE — Progress Notes (Signed)
Virtual Visit via Telephone Note  I connected with Dale Holt on 05/16/18 at 10:00 AM EDT by telephone and verified that I am speaking with the correct person using two identifiers.   I discussed the limitations, risks, security and privacy concerns of performing an evaluation and management service by telephone and the availability of in person appointments. We discussed the impact of the COVID-19 on the healthcare system, and the importance of social distancing and reducing patient and provider exposure. I also discussed with the patient that there may be a patient responsible charge related to this service. The patient expressed understanding and agreed to proceed.  Reason for visit: Discuss prostate MRI results  History of Present Illness: I had a phone visit follow-up with Dale Holt to discuss his prostate MRI results.  To briefly summarize, he is a 68 year old African-American male with past medical history notable for insulin-dependent diabetes, erectile dysfunction, and hypertension, as well as history of exploratory laparotomy and bowel resection.  He has no family history of prostate cancer.  He had a persistent rise in his PSA with a most recent value of 6.8 in December 2019.  He underwent a prostate biopsy approximately 10 years ago which was reportedly negative, but the results are unavailable to me.  He was very motivated to avoid prostate biopsy, and he proceeded with a 4K score.  This returned with a 10% risk of clinically significant prostate cancer.  At that point, he was willing to undergo prostate MRI to look for suspicious lesions.  Prostate MRI was performed 05/01/2018 and showed a 1.5 cm PI-RADS 4 lesion at the left mid and left lateral gland.  There was also possible focal extracapsular extension at the left mid gland.  There is no pelvic lymphadenopathy.  Assessment and Plan: In summary, the patient is a 68 year old male with PSA of 6.8 and PI-RADS 4 lesion on recent  prostate MRI.  We discussed these findings at length, and I recommended undergoing a fusion biopsy in [facility] for definitive diagnosis.  We reviewed at length that the MRI is not 100% specific, however his findings are certainly worrisome for clinically significant prostate cancer.  We again reviewed the risks and benefits of transrectal prostate biopsy including risks of bleeding, infection, life-threatening sepsis, pain, and temporary lower urinary tract symptoms.  Follow Up: -MRI fusion biopsy in [facility], then follow-up with me to discuss pathology results   I discussed the assessment and treatment plan with the patient. The patient was provided an opportunity to ask questions and all were answered. The patient agreed with the plan and demonstrated an understanding of the instructions.   The patient was advised to call back or seek an in-person evaluation if the symptoms worsen or if the condition fails to improve as anticipated.  I provided 11 minutes of non-face-to-face time during this encounter.   Billey Co, MD

## 2018-06-07 ENCOUNTER — Telehealth: Payer: Self-pay | Admitting: Urology

## 2018-06-07 NOTE — Telephone Encounter (Signed)
Referral entered in the proficient WQ and sent to [facility]

## 2018-06-07 NOTE — Telephone Encounter (Signed)
-----   Message from Billey Co, MD sent at 05/16/2018 10:04 AM EDT ----- Regarding: MRI fusion bx at [facility] Please arrange MRI fusion prostate biopsy in [place] in the next 1-2 months. Thanks  Nickolas Madrid, MD 05/16/2018

## 2018-08-15 ENCOUNTER — Other Ambulatory Visit: Payer: Self-pay | Admitting: Urology

## 2018-08-16 ENCOUNTER — Telehealth: Payer: Self-pay | Admitting: Urology

## 2018-08-16 NOTE — Telephone Encounter (Signed)
Unable to reach patient to schedule Could not leave a message no voicemail   Dale Holt

## 2018-08-16 NOTE — Telephone Encounter (Signed)
-----   Message from Billey Co, MD sent at 08/16/2018 12:57 PM EDT ----- Regarding: follow up Please schedule next available follow up to discuss prostate biopsy results  Nickolas Madrid, MD 08/16/2018

## 2018-08-21 ENCOUNTER — Encounter: Payer: Self-pay | Admitting: Urology

## 2018-08-21 ENCOUNTER — Ambulatory Visit (INDEPENDENT_AMBULATORY_CARE_PROVIDER_SITE_OTHER): Payer: Medicare HMO | Admitting: Urology

## 2018-08-21 ENCOUNTER — Other Ambulatory Visit: Payer: Self-pay

## 2018-08-21 VITALS — BP 161/96 | HR 83 | Ht 70.0 in | Wt 157.0 lb

## 2018-08-21 DIAGNOSIS — C61 Malignant neoplasm of prostate: Secondary | ICD-10-CM

## 2018-08-21 NOTE — Progress Notes (Signed)
° °  08/21/2018 1:24 PM   Dale Holt July 20, 1950 [id number]  Reason for visit: Review MRI fusion biopsy results  HPI: I saw Dale Holt back in urology clinic today to discuss his MRI fusion prostate biopsy results.  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  He has no family history of prostate cancer.  He has a history of negative prostate biopsy 10 years ago.  He presented with an elevated PSA of 6.8 in December 2019, and elected to proceed with a 4K score to try and avoid unnecessary biopsy.  This showed a 10% risk of clinically significant prostate cancer, and he underwent a prostate MRI on 05/01/2018.  This showed a 1.5 cm PI-RADS 4 lesion at the left mid and lateral gland.  There was no pelvic lymphadenopathy.  He underwent fusion biopsy at [facility] in [place] on 08/10/2018.  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  3/3 cores from the region of interest were positive for 3+3 = 6 prostate cancer with 30% max core involvement.  We had a lengthy conversation today about the patient's new diagnosis of prostate cancer.  We reviewed the risk classifications per the AUA guidelines including very low risk, low risk, intermediate risk, and high risk disease, and the need for additional staging imaging with CT and bone scan in patients with unfavorable intermediate risk and high risk disease.  I explained that his life expectancy, clinical stage, Gleason score, PSA, and other co-morbidities influence treatment strategies.  We discussed the roles of active surveillance, radiation therapy, surgical therapy with robotic prostatectomy, and hormone therapy with androgen deprivation.  We discussed that  patients urinary symptoms also impact treatment strategy, as patients with severe lower urinary tract symptoms may have significant worsening or even develop urinary retention after undergoing radiation.  We discussed different treatment options including watchful waiting, active surveillance, radiation, and surgery.  In summary, Dale Holt is a 68 y.o. man with newly diagnosed low risk prostate cancer. He would like to pursue active surveillance.  He is already done quite a bit of research online, and he is comfortable with an active surveillance approach.  We discussed the risks of active surveillance at length, including very low risk of developing metastatic prostate cancer and even death.  -RTC for PSA in 6 months -If persistently rising PSA, would refer for consideration of radiation with his history of extensive abdominal surgery and low risk disease  A total of 15 minutes were spent face-to-face with the patient, greater than 50% was spent in patient education, counseling, and coordination of care regarding new diagnosis of low risk prostate cancer, treatment options, and need for ongoing surveillance.   Billey Co, [facility] [address] [place], [place] [address] [phone]

## 2019-02-15 ENCOUNTER — Other Ambulatory Visit: Payer: Medicare HMO

## 2019-02-15 ENCOUNTER — Other Ambulatory Visit: Payer: Self-pay

## 2019-02-15 DIAGNOSIS — C61 Malignant neoplasm of prostate: Secondary | ICD-10-CM

## 2019-02-16 LAB — PSA: Prostate Specific Ag, Serum: 6.8 ng/mL — ABNORMAL HIGH (ref 0.0–4.0)

## 2019-02-21 ENCOUNTER — Encounter: Payer: Self-pay | Admitting: Urology

## 2019-02-21 ENCOUNTER — Ambulatory Visit: Payer: Medicare HMO | Admitting: Urology

## 2019-02-21 ENCOUNTER — Other Ambulatory Visit: Payer: Self-pay

## 2019-02-21 VITALS — BP 136/81 | HR 98 | Ht 69.6 in | Wt 161.0 lb

## 2019-02-21 DIAGNOSIS — C61 Malignant neoplasm of prostate: Secondary | ICD-10-CM | POA: Diagnosis not present

## 2019-02-21 DIAGNOSIS — N529 Male erectile dysfunction, unspecified: Secondary | ICD-10-CM

## 2019-02-21 MED ORDER — SILDENAFIL CITRATE 100 MG PO TABS: 100.0000 mg | ORAL_TABLET | Freq: Every day | ORAL | 11 refills | Status: DC | PRN

## 2019-02-21 NOTE — Progress Notes (Signed)
   02/21/2019 1:13 PM   Dale Holt 1950-10-26 [id number]  Reason for visit: Follow up prostate cancer  HPI: I saw Dale Holt back in urology clinic for follow-up of low risk prostate cancer.  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  He originally presented with an elevated PSA of 6.8 in December 2019 and elected for a 4K score that showed a 10% risk of clinically significant prostate cancer.  He underwent a follow-up prostate MRI on 05/01/2018 which showed a 1.5 cm PI-RADS 4 lesion at the left mid lateral gland with no pelvic lymphadenopathy.  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  PSA today is stable at 6.8 from 6.8 in December 2019.  He denies any complaints today.  He specifically denies any weight loss, bone pain, urinary symptoms, or hematuria.  He has taken sildenafil in the past for erections but does not currently have a prescription for this medication is interested in trying this again.  The risk and benefits were discussed.  He would like to continue active surveillance which is very reasonable.   RTC 9 months with PSA prior  I spent 20 total minutes on the day of the encounter including pre-visit review of the medical record, face-to-face time with the patient, and post visit ordering of labs/imaging/tests.  Billey Co, [facility] [address] [place], [place] [address] [phone]

## 2019-02-22 ENCOUNTER — Ambulatory Visit: Payer: Medicare HMO | Admitting: Urology

## 2019-02-22 ENCOUNTER — Other Ambulatory Visit: Payer: Self-pay

## 2019-11-22 ENCOUNTER — Other Ambulatory Visit: Payer: Self-pay

## 2019-11-22 ENCOUNTER — Encounter: Payer: Self-pay | Admitting: Urology

## 2019-11-26 ENCOUNTER — Ambulatory Visit: Payer: Self-pay | Admitting: Urology

## 2019-11-26 ENCOUNTER — Encounter: Payer: Self-pay | Admitting: Urology

## 2020-05-19 ENCOUNTER — Encounter: Payer: Self-pay | Admitting: Emergency Medicine

## 2020-05-19 ENCOUNTER — Other Ambulatory Visit: Payer: Self-pay

## 2020-05-19 ENCOUNTER — Emergency Department: Payer: Medicare Other

## 2020-05-19 ENCOUNTER — Emergency Department
Admission: EM | Admit: 2020-05-19 | Discharge: 2020-05-19 | Disposition: A | Discharge status: Home / Self Care | Payer: Medicare Other | Attending: Emergency Medicine | Admitting: Emergency Medicine

## 2020-05-19 DIAGNOSIS — E1169 Type 2 diabetes mellitus with other specified complication: Secondary | ICD-10-CM | POA: Insufficient documentation

## 2020-05-19 DIAGNOSIS — Z7982 Long term (current) use of aspirin: Secondary | ICD-10-CM | POA: Insufficient documentation

## 2020-05-19 DIAGNOSIS — Z7984 Long term (current) use of oral hypoglycemic drugs: Secondary | ICD-10-CM | POA: Insufficient documentation

## 2020-05-19 DIAGNOSIS — E785 Hyperlipidemia, unspecified: Secondary | ICD-10-CM | POA: Insufficient documentation

## 2020-05-19 DIAGNOSIS — Z79899 Other long term (current) drug therapy: Secondary | ICD-10-CM | POA: Diagnosis not present

## 2020-05-19 DIAGNOSIS — I10 Essential (primary) hypertension: Secondary | ICD-10-CM | POA: Insufficient documentation

## 2020-05-19 DIAGNOSIS — R197 Diarrhea, unspecified: Secondary | ICD-10-CM | POA: Diagnosis not present

## 2020-05-19 DIAGNOSIS — E860 Dehydration: Secondary | ICD-10-CM | POA: Insufficient documentation

## 2020-05-19 DIAGNOSIS — R112 Nausea with vomiting, unspecified: Secondary | ICD-10-CM | POA: Insufficient documentation

## 2020-05-19 DIAGNOSIS — Z8547 Personal history of malignant neoplasm of testis: Secondary | ICD-10-CM | POA: Diagnosis not present

## 2020-05-19 DIAGNOSIS — R109 Unspecified abdominal pain: Secondary | ICD-10-CM | POA: Insufficient documentation

## 2020-05-19 LAB — CBC
HCT: 42.7 % (ref 39.0–52.0)
Hemoglobin: 14.2 g/dL (ref 13.0–17.0)
MCH: 30.1 pg (ref 26.0–34.0)
MCHC: 33.3 g/dL (ref 30.0–36.0)
MCV: 90.5 fL (ref 80.0–100.0)
Platelets: 272 10*3/uL (ref 150–400)
RBC: 4.72 MIL/uL (ref 4.22–5.81)
RDW: 14.1 % (ref 11.5–15.5)
WBC: 10.8 10*3/uL — ABNORMAL HIGH (ref 4.0–10.5)
nRBC: 0 % (ref 0.0–0.2)

## 2020-05-19 LAB — URINALYSIS, COMPLETE (UACMP) WITH MICROSCOPIC
Bacteria, UA: NONE SEEN
Bilirubin Urine: NEGATIVE
Glucose, UA: 150 mg/dL — AB
Hgb urine dipstick: NEGATIVE
Ketones, ur: 5 mg/dL — AB
Nitrite: NEGATIVE
Protein, ur: NEGATIVE mg/dL
Specific Gravity, Urine: 1.027 (ref 1.005–1.030)
pH: 5 (ref 5.0–8.0)

## 2020-05-19 LAB — COMPREHENSIVE METABOLIC PANEL
ALT: 22 U/L (ref 0–44)
AST: 23 U/L (ref 15–41)
Albumin: 4.4 g/dL (ref 3.5–5.0)
Alkaline Phosphatase: 44 U/L (ref 38–126)
Anion gap: 12 (ref 5–15)
BUN: 20 mg/dL (ref 8–23)
CO2: 23 mmol/L (ref 22–32)
Calcium: 8.9 mg/dL (ref 8.9–10.3)
Chloride: 103 mmol/L (ref 98–111)
Creatinine, Ser: 0.84 mg/dL (ref 0.61–1.24)
GFR, Estimated: >60 mL/min (ref >60)
Glucose, Bld: 219 mg/dL — ABNORMAL HIGH (ref 70–99)
Potassium: 4.4 mmol/L (ref 3.5–5.1)
Sodium: 138 mmol/L (ref 135–145)
Total Bilirubin: 1.1 mg/dL (ref 0.3–1.2)
Total Protein: 7.5 g/dL (ref 6.5–8.1)

## 2020-05-19 LAB — LIPASE, BLOOD: Lipase: 41 U/L (ref 11–51)

## 2020-05-19 MED ORDER — LACTATED RINGERS IV BOLUS
1000.0000 mL | Freq: Once | INTRAVENOUS | Status: AC
  Administered 2020-05-19: 1000 mL via INTRAVENOUS

## 2020-05-19 MED ORDER — ONDANSETRON HCL 4 MG/2ML IJ SOLN
4.0000 mg | Freq: Once | INTRAMUSCULAR | Status: AC
  Administered 2020-05-19: 4 mg via INTRAVENOUS
  Filled 2020-05-19: qty 2

## 2020-05-19 MED ORDER — ONDANSETRON 4 MG PO TBDP: 4.0000 mg | ORAL_TABLET | Freq: Three times a day (TID) | ORAL | 0 refills | Status: DC | PRN

## 2020-05-19 MED ORDER — CEPHALEXIN 500 MG PO CAPS: 500.0000 mg | ORAL_CAPSULE | Freq: Three times a day (TID) | ORAL | 0 refills | Status: AC

## 2020-05-19 MED ORDER — CEPHALEXIN 500 MG PO CAPS: 500.0000 mg | ORAL_CAPSULE | Freq: Three times a day (TID) | ORAL | 0 refills | Status: DC

## 2020-05-19 MED ORDER — MORPHINE SULFATE (PF) 4 MG/ML IV SOLN
4.0000 mg | Freq: Once | INTRAVENOUS | Status: AC
  Administered 2020-05-19: 4 mg via INTRAVENOUS
  Filled 2020-05-19: qty 1

## 2020-05-19 NOTE — ED Notes (Signed)
Provided water and crackers for PO challenge. 

## 2020-05-19 NOTE — ED Triage Notes (Signed)
Pt via POV from home. Pt c/o dizziness, NV, diarrhea, and lower quad abdominal pain that start last night. Pt states everything he eats or drink he vomits it back up. Pt is A&Ox4 and NAD.

## 2020-05-19 NOTE — ED Provider Notes (Signed)
[facility] Emergency Department Provider Note ____________________________________________   Event Date/Time   First MD Initiated Contact with Patient 05/19/20 1414     (approximate)  I have reviewed the triage vital signs and the nursing notes.  HISTORY  Chief Complaint Dizziness and Emesis   HPI Dale Holt is a 70 y.o. malewho presents to the ED for evaluation of abdominal pain, emesis and dizziness.  Chart review indicates history of DM, HTN and HLD.  Patient presents to the ED, accompanied by his wife who provides some additional history, for evaluation of recurrent emesis, nausea and diarrhea this morning.  Patient reports feeling normal yesterday, but awakening this morning with nausea, recurrent emesis of nonbloody nonbilious emesis, and a couple episodes of watery diarrhea.  They report about 4 episodes of emesis.  Patient reports mild central abdominal discomfort, but minimizes this, and reports primarily nausea.  Denies fevers, chest pain, shortness of breath, choking episodes, dysuria or hematuria. Denies recreational drug use, cannabis use.  Reports some associated lightheaded dizziness when he gets up and presyncope without syncope.   Past Medical History:  Diagnosis Date  . Anemia   . Diabetes mellitus without complication ([place])   . Glaucoma   . Hypercholesterolemia   . Hypertension   . Legally blind in right eye, as defined in [place]   . Testicle cancer [facility])     Patient Active Problem List   Diagnosis Date Noted  . Pseudophakia of left eye 02/01/2018  . Elevated PSA 08/11/2016  . Encounter for general adult medical examination without abnormal findings 08/11/2016  . History of normocytic normochromic anemia 06/30/2015  . Vaccine counseling 06/30/2015  . Pure hypercholesterolemia 06/13/2013  . Cataracts, bilateral 08/12/2011  . Dry eye syndrome 08/12/2011  . Essential hypertension 08/12/2011  . Nuclear sclerotic cataract of  both eyes 08/12/2011  . Primary open angle glaucoma 08/12/2011  . Type 2 diabetes mellitus with hyperlipidemia ([place]) 08/12/2011  . Unspecified glaucoma 08/12/2011    Past Surgical History:  Procedure Laterality Date  . COLONOSCOPY WITH PROPOFOL N/A 04/13/2016   Procedure: COLONOSCOPY WITH PROPOFOL;  Surgeon: Lollie Sails, MD;  Location: [facility] ENDOSCOPY;  Service: Endoscopy;  Laterality: N/A;  . EYE SURGERY    . teticle surgery      Prior to Admission medications   Medication Sig Start Date End Date Taking? Authorizing Provider  amLODipine-atorvastatin (CADUET) 5-10 MG tablet Take 1 tablet by mouth daily.    [provider]  aspirin EC 81 MG tablet Take 81 mg by mouth daily.    [provider]  brimonidine (ALPHAGAN) 0.2 % ophthalmic solution Place into both eyes 2 (two) times daily.    [provider]  brimonidine-timolol (COMBIGAN) 0.2-0.5 % ophthalmic solution Place 1 drop into both eyes 2 (two) times daily.    [provider]  cephALEXin (KEFLEX) 500 MG capsule Take 1 capsule (500 mg total) by mouth 3 (three) times daily for 5 days. 05/19/20 05/24/20  Vladimir Crofts, MD  dorzolamide-timolol (COSOPT) 22.3-6.8 MG/ML ophthalmic solution 1 drop 2 (two) times daily. 09/04/18   [provider]  glipiZIDE (GLUCOTROL) 10 MG tablet Take 20 mg by mouth 2 (two) times daily with a meal.    [provider]  latanoprost (XALATAN) 0.005 % ophthalmic solution Place 1 drop into both eyes at bedtime.    [provider]  metFORMIN (GLUCOPHAGE) 1000 MG tablet Take 1,000 mg by mouth 2 (two) times daily with a meal.  [provider]  Multiple Vitamin (MULTIVITAMIN) tablet Take 1 tablet by mouth daily.    [provider]  ondansetron (ZOFRAN ODT) 4 MG disintegrating tablet Take 1 tablet (4 mg total) by mouth every 8 (eight) hours as needed for nausea or vomiting. 05/19/20   Vladimir Crofts, MD  ramipril (ALTACE) 5 MG capsule Take 5  mg by mouth daily.    [provider]  sildenafil (VIAGRA) 100 MG tablet Take 1 tablet (100 mg total) by mouth daily as needed for erectile dysfunction. 02/21/19   Billey Co, MD    Allergies Patient has no known allergies.  History reviewed. No pertinent family history.  Social History Social History   Tobacco Use  . Smoking status: Never Smoker  . Smokeless tobacco: Never Used  Substance Use Topics  . Alcohol use: Yes  . Drug use: Never    Review of Systems  Constitutional: No fever/chills Eyes: No visual changes. ENT: No sore throat. Cardiovascular: Denies chest pain. Respiratory: Denies shortness of breath. Gastrointestinal: Positive for abdominal pain, nausea, vomiting or diarrhea.  No constipation. Genitourinary: Negative for dysuria. Musculoskeletal: Negative for back pain. Skin: Negative for rash. Neurological: Negative for headaches, focal weakness or numbness.  ____________________________________________   PHYSICAL EXAM:  VITAL SIGNS: Vitals:   05/19/20 1745 05/19/20 1746  BP:  127/73  Pulse: 100 100  Resp:  20  Temp:  99.1 F (37.3 C)  SpO2: 98% 98%    Constitutional: Alert and oriented. Well appearing and in no acute distress. Eyes: Conjunctivae are normal. PERRL. EOMI. Head: Atraumatic. Nose: No congestion/rhinnorhea. Mouth/Throat: Mucous membranes are dry.  Oropharynx non-erythematous. Neck: No stridor. No cervical spine tenderness to palpation. Cardiovascular: Tachycardic rate, regular rhythm. Grossly normal heart sounds.  Good peripheral circulation. Respiratory: Normal respiratory effort.  No retractions. Lungs CTAB. Gastrointestinal: Soft , nondistended. No CVA tenderness. Minimal periumbilical tenderness to palpation without peritoneal features.  Abdomen is otherwise benign. Musculoskeletal: No lower extremity tenderness nor edema.  No joint effusions. No signs of acute trauma. Neurologic:  Normal speech and language. No  gross focal neurologic deficits are appreciated. No gait instability noted. Skin:  Skin is warm, dry and intact. No rash noted. Psychiatric: Mood and affect are normal. Speech and behavior are normal. ____________________________________________   LABS (all labs ordered are listed, but only abnormal results are displayed)  Labs Reviewed  COMPREHENSIVE METABOLIC PANEL - Abnormal; Notable for the following components:      Result Value   Glucose, Bld 219 (*)    All other components within normal limits  CBC - Abnormal; Notable for the following components:   WBC 10.8 (*)    All other components within normal limits  URINALYSIS, COMPLETE (UACMP) WITH MICROSCOPIC - Abnormal; Notable for the following components:   Color, Urine YELLOW (*)    APPearance HAZY (*)    Glucose, UA 150 (*)    Ketones, ur 5 (*)    Leukocytes,Ua MODERATE (*)    All other components within normal limits  URINE CULTURE  LIPASE, BLOOD   ____________________________________________  12 Lead EKG  Sinus tachycardia, rate of 123 bpm.  Normal axis and intervals.  Nonspecific ST changes inferiorly without further evidence of ischemia and no STEMI criteria. ____________________________________________  RADIOLOGY  ED MD interpretation: CT renal study reviewed by me without evidence of bowel or urologic obstruction.  Official radiology report(s): CT ABDOMEN PELVIS WO CONTRAST  Result Date: 05/19/2020 CLINICAL DATA:  Nausea, vomiting, diarrhea and lower quadrant abdominal pain since  last evening. EXAM: CT ABDOMEN AND PELVIS WITHOUT CONTRAST TECHNIQUE: Multidetector CT imaging of the abdomen and pelvis was performed following the standard protocol without IV contrast. COMPARISON:  09/15/2010 FINDINGS: Lower chest: The lung bases are clear of an acute process. Minimal right basilar scarring changes. The heart is normal in size. Stable aortic and coronary artery calcifications. Hepatobiliary: No hepatic lesions or  intrahepatic biliary dilatation. The gallbladder appears normal. No common bile duct dilatation. Pancreas: No mass, inflammation or ductal dilatation is appreciated without contrast. Spleen: Normal size.  No focal lesions. Adrenals/Urinary Tract: The adrenal glands are unremarkable. Bilateral renal calculi but no obstructing ureteral calculi or bladder calculi. No worrisome renal or bladder lesions. Stomach/Bowel: The stomach, duodenum, small bowel and colon are unremarkable. No acute inflammatory changes, mass lesions or obstructive findings. Terminal ileum and appendix are normal. Remote surgical changes involving a small bowel loop in the right abdomen. Vascular/Lymphatic: Scattered atherosclerotic calcifications involving the aorta and branch vessels but no aneurysm. No mesenteric or retroperitoneal mass or adenopathy. Reproductive: The prostate gland and seminal vesicles are unremarkable. Other: No pelvic mass or adenopathy. No free pelvic fluid collections. No inguinal mass or adenopathy. Small periumbilical abdominal wall hernia containing fat and a single small bowel loop but no findings for obstruction or incarceration. Musculoskeletal: No significant bony findings. IMPRESSION: 1. No acute abdominal/pelvic findings, mass lesions or adenopathy. 2. Bilateral renal calculi but no obstructing ureteral calculi or bladder calculi. 3. Small periumbilical abdominal wall hernia containing fat and a single small bowel loop but no findings for obstruction or incarceration. Aortic Atherosclerosis (ICD10-I70.0). Electronically Signed   By: Marijo Sanes M.D.   On: 05/19/2020 15:47    ____________________________________________   PROCEDURES and INTERVENTIONS  Procedure(s) performed (including Critical Care):  .1-3 Lead EKG Interpretation Performed by: Vladimir Crofts, MD Authorized by: Vladimir Crofts, MD     Interpretation: abnormal     ECG rate:  108   ECG rate assessment: tachycardic     Rhythm: sinus  tachycardia     Ectopy: none     Conduction: normal      Medications  lactated ringers bolus 1,000 mL (0 mLs Intravenous Stopped 05/19/20 1746)  ondansetron (ZOFRAN) injection 4 mg (4 mg Intravenous Given 05/19/20 1454)  morphine 4 MG/ML injection 4 mg (4 mg Intravenous Given 05/19/20 1454)  lactated ringers bolus 1,000 mL (0 mLs Intravenous Stopped 05/19/20 1657)    ____________________________________________   MDM / ED COURSE   70 year old male presents to the ED with a few hours of nausea, vomiting and diarrhea, with evidence of dehydration and acute cystitis, ultimately amenable to outpatient management.  Presents tachycardic, improving with fluid resuscitation, but remains stable.  Exam demonstrates a benign abdomen, dry mucous membranes.  Blood work shows minimal leukocytosis is otherwise unremarkable.  His urinalysis is hazy and has moderate leukocytes, with his symptoms of some mild burning with urination, I am concerned about acute cystitis contributing to his symptoms.  This was sent for culture and he was empirically started on a short course of Macrobid.  He looks much better and reports feeling much better after fluid resuscitation, oral and IV, and I believe a suitable for trial of outpatient management.  Return precautions for the ED were discussed.  No signs of pyelonephritis, sepsis, nephrolithiasis or SBO.  Clinical Course as of 05/19/20 1818  Mon May 19, 2020  1639 Reassessed.  Patient reports feeling better.  We discussed benign work-up so far.  We discussed additional liter of fluids  due to his persistent, although improved, tachycardia.  We discussed p.o. challenge and the possibility of outpatient management.  They are agreeable [DS]  [id number] Reassessed.  He is only gotten about 200 cc of his second bag, not flowing due to positional IV changes.  I educate him of need to keep his arm a little more straight.  He has drank a full cup of water without nausea, abdominal pain or  emesis.  We discussed possibility of outpatient management.  He is eager and agreeable. [DS]  [id number].  Fluids are about halfway finished and patient reported he is very eager to leave.  We discussed his urinalysis results and he does report "some mild" burning with urination.  We discussed treatment for acute cystitis and he is agreeable. [DS]    Clinical Course User Index [DS] Vladimir Crofts, MD    ____________________________________________   FINAL CLINICAL IMPRESSION(S) / ED DIAGNOSES  Final diagnoses:  Dehydration  Nausea vomiting and diarrhea     ED Discharge Orders         Ordered    ondansetron (ZOFRAN ODT) 4 MG disintegrating tablet  Every 8 hours PRN,   Status:  Discontinued        05/19/20 1813    cephALEXin (KEFLEX) 500 MG capsule  3 times daily,   Status:  Discontinued        05/19/20 1813    cephALEXin (KEFLEX) 500 MG capsule  3 times daily        05/19/20 1814    ondansetron (ZOFRAN ODT) 4 MG disintegrating tablet  Every 8 hours PRN        05/19/20 1814           Caryle Helgeson   Note:  This document was prepared using Dragon voice recognition software and may include unintentional dictation errors.   Vladimir Crofts, MD 05/19/20 [id number]

## 2020-05-19 NOTE — Discharge Instructions (Addendum)
You are be discharged with Keflex antibiotics to take 3 times daily for the next 5 days. Zofran nausea medicine use as needed.  Drink plenty of fluids to stay hydrated.  Return to the ED with any worsening symptoms despite these measures.

## 2020-05-21 LAB — URINE CULTURE: Culture: NO GROWTH

## 2020-07-10 ENCOUNTER — Ambulatory Visit: Payer: Medicare HMO | Admitting: Urology

## 2020-07-17 ENCOUNTER — Other Ambulatory Visit: Payer: Self-pay

## 2020-07-17 ENCOUNTER — Encounter: Payer: Self-pay | Admitting: Urology

## 2020-07-17 ENCOUNTER — Ambulatory Visit: Payer: Medicare Other | Admitting: Urology

## 2020-07-17 VITALS — BP 164/85 | HR 96 | Ht 70.5 in | Wt 160.0 lb

## 2020-07-17 DIAGNOSIS — N529 Male erectile dysfunction, unspecified: Secondary | ICD-10-CM | POA: Diagnosis not present

## 2020-07-17 DIAGNOSIS — C61 Malignant neoplasm of prostate: Secondary | ICD-10-CM

## 2020-07-17 MED ORDER — SILDENAFIL CITRATE 100 MG PO TABS: 100.0000 mg | ORAL_TABLET | Freq: Every day | ORAL | 6 refills | Status: DC | PRN

## 2020-07-17 NOTE — Progress Notes (Signed)
   07/17/2020 4:26 PM   Dale Holt 02/16/1950 [id number]  Reason for visit: Follow up prostate cancer on active surveillance, ED  HPI: I saw Dale Holt back in urology clinic for the above issues.  I last saw him in February 2021 and he has been lost to follow-up since that time.  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 He originally presented with an elevated PSA of 6.8 in December 2019 and elected for a 4K score that showed a 10% risk of clinically significant prostate cancer.  He underwent a follow-up prostate MRI on 05/01/2018 which showed a 1.5 cm PI-RADS 4 lesion at the left mid lateral gland with no pelvic lymphadenopathy.  He underwent a fusion biopsy at [facility] in [place] on 08/10/2018 that showed a 28 g prostate with 2/12 random cores showing low risk Gleason score 3+3=6 prostate adenocarcinoma with 30% max core involvement, and 3/3 cores from the region of interest positive for Gleason score 3+3=6 prostate cancer with 30% max core involvement.  At our last visit I recommended follow-up in 9 months with PSA prior, but he has not followed up until now.  Since that time his been doing well.  He presented to the ED with a few months of malaise, nausea, vague abdominal pain, and some dysuria, and was felt to have a UTI based on urinalysis showing 20-50 WBCs but was otherwise benign.  Culture was ultimately negative.  He was treated with a course of antibiotics which he feels completely resolved his symptoms.  A PSA was also checked 2 weeks later after this possible infection and was 8.39 from 6.8 previously.  We discussed the variations in PSA and possible etiologies including acute infection.  I recommended repeating the PSA in 6 months, and if the PSA continues to rise considering repeat prostate biopsy.  He would like to defer repeat biopsy for the time  being which I think is reasonable in the setting of a possible UTI around the time of elevated PSA.  He is also had long-term ED.  He previously has been on sildenafil 100 mg with mixed results.  He is not tried this in a while secondary to cost.  He reports he tried Cialis previously without good results.  He was unfamiliar with good Rx, and I printed a prescription for [facility], and he is interested in trying the sildenafil again.  Risks and benefits discussed.  Trial of sildenafil 100 mg for ED RTC 6 months with PSA prior, consider prostate biopsy if significant increase   Billey Co, MD  [facility] [address] [place], [place] [address] [phone]

## 2020-12-19 ENCOUNTER — Other Ambulatory Visit: Payer: Self-pay

## 2020-12-19 ENCOUNTER — Ambulatory Visit
Admission: EM | Admit: 2020-12-19 | Discharge: 2020-12-19 | Disposition: A | Discharge status: Home / Self Care | Payer: Medicare Other | Attending: Medical Oncology | Admitting: Medical Oncology

## 2020-12-19 DIAGNOSIS — M542 Cervicalgia: Secondary | ICD-10-CM | POA: Diagnosis not present

## 2020-12-19 MED ORDER — LIDOCAINE 5 % EX PTCH: 1.0000 | MEDICATED_PATCH | CUTANEOUS | 0 refills | Status: DC

## 2020-12-19 MED ORDER — TIZANIDINE HCL 4 MG PO TABS: 4.0000 mg | ORAL_TABLET | Freq: Four times a day (QID) | ORAL | 0 refills | Status: DC | PRN

## 2020-12-19 NOTE — ED Provider Notes (Signed)
[facility]    CSN: [id number] Arrival date & time: 12/19/20  1225      History   Chief Complaint Chief Complaint  Patient presents with   Neck Pain    HPI Dale Holt is a 70 y.o. male.   HPI  Neck Pain: Pt states that he has had left sided neck pain. 5/10 in nature and worse when he turns his head. This started after he started working out with elastic bands. Feels like he must have pulled something. No known injury. He denies loss of grip strength, sensation or cardiac symptoms. He has tried massage which helped the one time that he did this.   Past Medical History:  Diagnosis Date   Anemia    Diabetes mellitus without complication ([place])    Glaucoma    Hypercholesterolemia    Hypertension    Legally blind in right eye, as defined in [place]    Testicle cancer [facility])     Patient Active Problem List   Diagnosis Date Noted   Pseudophakia of left eye 02/01/2018   Elevated PSA 08/11/2016   Encounter for general adult medical examination without abnormal findings 08/11/2016   History of normocytic normochromic anemia 06/30/2015   Vaccine counseling 06/30/2015   Pure hypercholesterolemia 06/13/2013   Cataracts, bilateral 08/12/2011   Dry eye syndrome 08/12/2011   Essential hypertension 08/12/2011   Nuclear sclerotic cataract of both eyes 08/12/2011   Primary open angle glaucoma 08/12/2011   Type 2 diabetes mellitus with hyperlipidemia ([place]) 08/12/2011   Unspecified glaucoma 08/12/2011    Past Surgical History:  Procedure Laterality Date   COLONOSCOPY WITH PROPOFOL N/A 04/13/2016   Procedure: COLONOSCOPY WITH PROPOFOL;  Surgeon: Lollie Sails, MD;  Location: [facility] ENDOSCOPY;  Service: Endoscopy;  Laterality: N/A;   EYE SURGERY     teticle surgery         Home Medications    Prior to Admission medications   Medication Sig Start Date End Date Taking? Authorizing Provider  amLODipine-atorvastatin (CADUET) 5-10 MG tablet Take 1 tablet by mouth  daily.    [provider]  aspirin EC 81 MG tablet Take 81 mg by mouth daily.    [provider]  brimonidine (ALPHAGAN) 0.2 % ophthalmic solution Place into both eyes 2 (two) times daily.    [provider]  brimonidine-timolol (COMBIGAN) 0.2-0.5 % ophthalmic solution Place 1 drop into both eyes 2 (two) times daily.    [provider]  dorzolamide-timolol (COSOPT) 22.3-6.8 MG/ML ophthalmic solution 1 drop 2 (two) times daily. 09/04/18   [provider]  glipiZIDE (GLUCOTROL) 10 MG tablet Take 20 mg by mouth 2 (two) times daily with a meal.    [provider]  latanoprost (XALATAN) 0.005 % ophthalmic solution Place 1 drop into both eyes at bedtime.    [provider]  metFORMIN (GLUCOPHAGE) 1000 MG tablet Take 1,000 mg by mouth 2 (two) times daily with a meal.    [provider]  Multiple Vitamin (MULTIVITAMIN) tablet Take 1 tablet by mouth daily.    [provider]  ondansetron (ZOFRAN ODT) 4 MG disintegrating tablet Take 1 tablet (4 mg total) by mouth every 8 (eight) hours as needed for nausea or vomiting. 05/19/20   Vladimir Crofts, MD  ramipril (ALTACE) 5 MG capsule Take 5 mg by mouth daily.    [provider]  sildenafil (VIAGRA) 100 MG tablet Take 1 tablet (100 mg total) by mouth daily as needed for erectile dysfunction.  07/17/20   Billey Co, MD    Family History History reviewed. No pertinent family history.  Social History Social History   Tobacco Use   Smoking status: Never   Smokeless tobacco: Never  Substance Use Topics   Alcohol use: Yes   Drug use: Never     Allergies   Patient has no known allergies.   Review of Systems Review of Systems  As stated above in HPI Physical Exam Triage Vital Signs ED Triage Vitals  Enc Vitals Group     BP 12/19/20 1313 (!) 154/94     Pulse Rate 12/19/20 1313 97     Resp 12/19/20 1313 18     Temp 12/19/20 1313 98.8 F (37.1 C)     Temp  Source 12/19/20 1313 Oral     SpO2 12/19/20 1313 100 %     Weight 12/19/20 1312 160 lb (72.6 kg)     Height 12/19/20 1312 5' 10.5" (1.791 m)     Head Circumference --      Peak Flow --      Pain Score 12/19/20 1312 5     Pain Loc --      Pain Edu? --      Excl. in [place]? --    No data found.  Updated Vital Signs BP (!) 154/94 (BP Location: Left Arm)    Pulse 97    Temp 98.8 F (37.1 C) (Oral)    Resp 18    Ht 5' 10.5" (1.791 m)    Wt 160 lb (72.6 kg)    SpO2 100%    BMI 22.63 kg/m   Physical Exam Vitals and nursing note reviewed.  Constitutional:      General: He is not in acute distress.    Appearance: Normal appearance. He is not ill-appearing, toxic-appearing or diaphoretic.  HENT:     Head: Normocephalic and atraumatic.     Right Ear: Tympanic membrane normal.     Left Ear: Tympanic membrane normal.     Nose: Nose normal.     Mouth/Throat:     Mouth: Mucous membranes are moist.  Eyes:     Extraocular Movements: Extraocular movements intact.     Conjunctiva/sclera: Conjunctivae normal.     Pupils: Pupils are equal, round, and reactive to light.  Cardiovascular:     Rate and Rhythm: Normal rate and regular rhythm.     Heart sounds: Normal heart sounds.  Pulmonary:     Effort: Pulmonary effort is normal.     Breath sounds: Normal breath sounds.  Musculoskeletal:     Cervical back: Normal range of motion and neck supple. Tenderness (Trapezius muscle of the left sided especially at the attachment sites) present. No rigidity.  Skin:    General: Skin is warm.  Neurological:     General: No focal deficit present.     Mental Status: He is alert and oriented to person, place, and time.     Motor: No weakness.     Coordination: Coordination normal.     Gait: Gait normal.     Deep Tendon Reflexes: Reflexes normal.     UC Treatments / Results  Labs (all labs ordered are listed, but only abnormal results are displayed) Labs Reviewed - No data to  display  EKG   Radiology No results found.  Procedures Procedures (including critical care time)  Medications Ordered in UC Medications - No data to display  Initial Impression / Assessment and Plan / UC Course  I have reviewed the triage vital signs and the nursing notes.  Pertinent labs & imaging results that were available during my care of the patient were reviewed by me and considered in my medical decision making (see chart for details).     New. Appears to be MSK in nature which we discussed. Treating with lidocaine patches per their request, tizanidine, massage, heating pad. Discussed red flag signs and symptoms. Follow up PRN.  Final Clinical Impressions(s) / UC Diagnoses   Final diagnoses:  None   Discharge Instructions   None    ED Prescriptions   None    PDMP not reviewed this encounter.   Hughie Closs, [place] 12/19/20 1436

## 2020-12-19 NOTE — ED Triage Notes (Signed)
Pt here with C/O left side neck pain, Pt states he did start using exercise stretch ropes and has been hurting since.

## 2021-01-15 ENCOUNTER — Other Ambulatory Visit: Payer: Medicare Other

## 2021-01-15 ENCOUNTER — Other Ambulatory Visit: Payer: Self-pay

## 2021-01-15 DIAGNOSIS — C61 Malignant neoplasm of prostate: Secondary | ICD-10-CM

## 2021-01-16 ENCOUNTER — Other Ambulatory Visit: Payer: Self-pay

## 2021-01-16 LAB — PSA: Prostate Specific Ag, Serum: 8.4 ng/mL — ABNORMAL HIGH (ref 0.0–4.0)

## 2021-01-22 ENCOUNTER — Other Ambulatory Visit: Payer: Self-pay

## 2021-01-22 ENCOUNTER — Ambulatory Visit: Payer: Medicare Other | Admitting: Urology

## 2021-01-22 ENCOUNTER — Encounter: Payer: Self-pay | Admitting: Urology

## 2021-01-22 VITALS — BP 134/82 | HR 80 | Ht 70.5 in | Wt 160.0 lb

## 2021-01-22 DIAGNOSIS — N529 Male erectile dysfunction, unspecified: Secondary | ICD-10-CM

## 2021-01-22 DIAGNOSIS — C61 Malignant neoplasm of prostate: Secondary | ICD-10-CM

## 2021-01-22 MED ORDER — SILDENAFIL CITRATE 100 MG PO TABS: 100.0000 mg | ORAL_TABLET | Freq: Every day | ORAL | 6 refills | Status: DC | PRN

## 2021-01-22 NOTE — Progress Notes (Signed)
° °  01/22/2021 4:18 PM   [name] 05/29/1950 [id number]  Reason for visit: Follow up prostate cancer on active surveillance, ED   HPI: I saw Mr. Dale Holt back in urology clinic for the above issues.  Briefly, 71 year old male with past medical history notable for insulin-dependent diabetes, ED, history of exploratory laparotomy and bowel resection for neuroendocrine tumor. He originally presented with an elevated PSA of 6.8 in December 2019 and elected for a 4K score that showed a 10% risk of clinically significant prostate cancer.  He underwent a follow-up prostate MRI on 05/01/2018 which showed a 1.5 cm PI-RADS 4 lesion at the left mid lateral gland with no pelvic lymphadenopathy.  He underwent a fusion biopsy at [facility] in [place] on 08/10/2018 that showed a 28 g prostate with 2/12 random cores showing low risk Gleason score 3+3=6 prostate adenocarcinoma with 30% max core involvement, and 3/3 cores from the region of interest positive for Gleason score 3+3=6 prostate cancer with 30% max core involvement.   He was lost to follow-up temporarily during the [place] pandemic.  He had a UTI in May 2022 treated with antibiotics which resolved his symptoms, and PSA just a few weeks after UTI had increased slightly to 8.39.  He denies any problems over the last 6 months.  Repeat PSA 01/15/2021 is stable at 8.4.  We again reviewed the AUA guidelines regarding low risk prostate cancer, and that active surveillance is the first-line treatment.  Risks and benefits discussed.  I also recommended considering a repeat biopsy at some point in the next 1 to 2 years, as he never underwent a confirmatory biopsy after being lost to follow-up during the [place] pandemic.  He would like to avoid any biopsies unless absolutely necessary, and would like to continue to monitor the PSA at this time.  His PSA is relatively stable, but we discussed the low, but nonzero, risk of progression of disease by deferring biopsy or  MRI.   He is also had long-term ED. currently using sildenafil 100 mg on demand with good results.  Continue sildenafil 100 mg for ED RTC 9 months with PSA prior, consider prostate biopsy if significant increase   Billey Co, MD  [facility] [address] [place], [place] [address] [phone]

## 2021-10-09 ENCOUNTER — Other Ambulatory Visit: Payer: Medicare Other

## 2021-10-22 ENCOUNTER — Ambulatory Visit: Payer: Medicare Other | Admitting: Urology

## 2021-10-23 ENCOUNTER — Encounter: Payer: Self-pay | Admitting: Urology

## 2021-12-18 IMAGING — CT CT ABD-PELV W/O CM
2 of 4 series · 15 of 46 positions shown, 17 images · non-contrast
Comparison: 09/15/2010

CLINICAL DATA: Nausea, vomiting, diarrhea and lower quadrant
abdominal pain since last evening.

EXAM:
CT ABDOMEN AND PELVIS WITHOUT CONTRAST
TECHNIQUE: Multidetector CT imaging of the abdomen and pelvis was performed
following the standard protocol without IV contrast.

[Series 2: routine abd/pel wo · axial · 0.76mm/px · z∈[+595,+1025]mm · 12 of 98 slices shown, 14 images]
[im 8/98  soft-tissue]
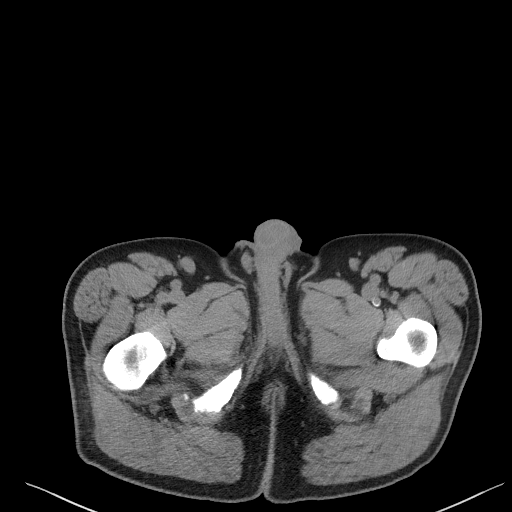
[im 8/98  bone]
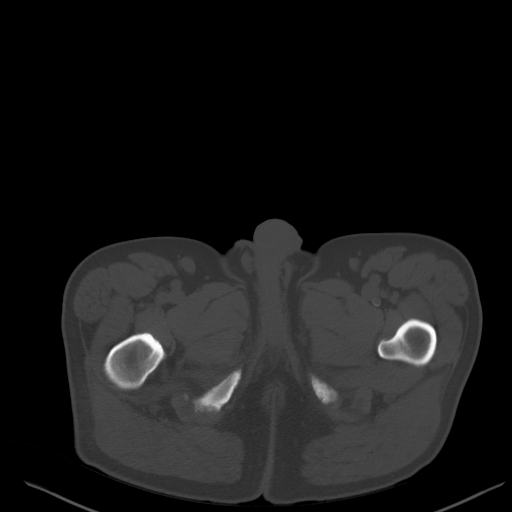
[im 16/98  soft-tissue]
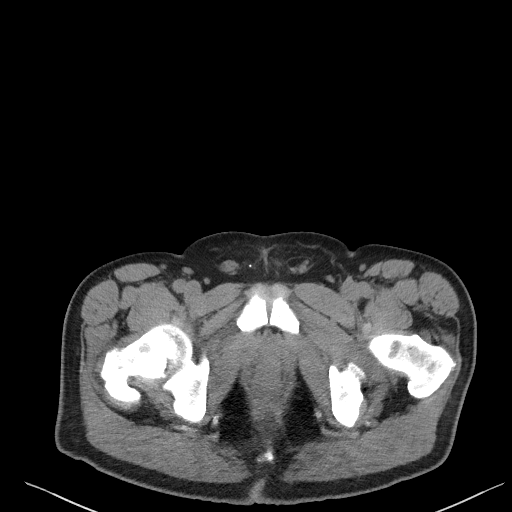
[im 24/98  soft-tissue]
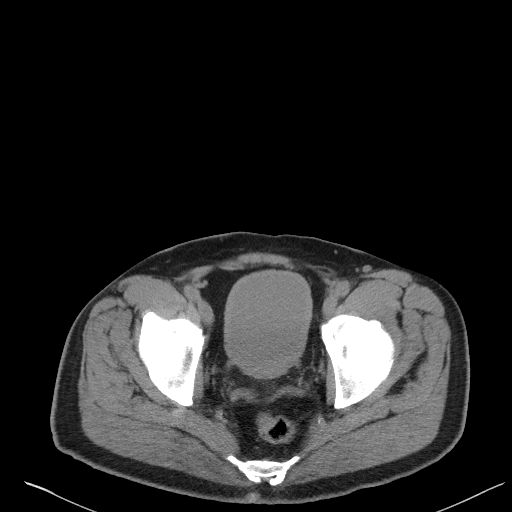
[im 32/98  soft-tissue]
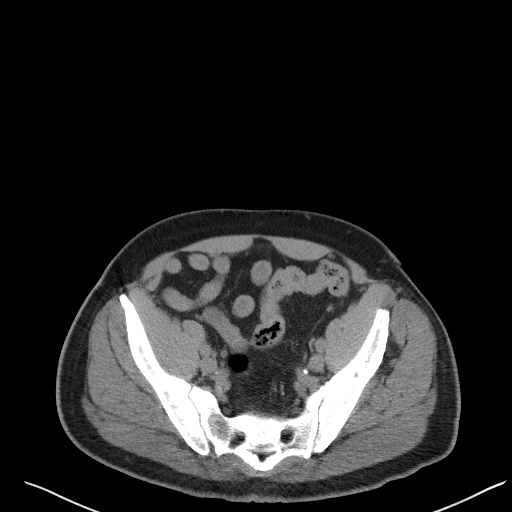
[im 39/98  soft-tissue]
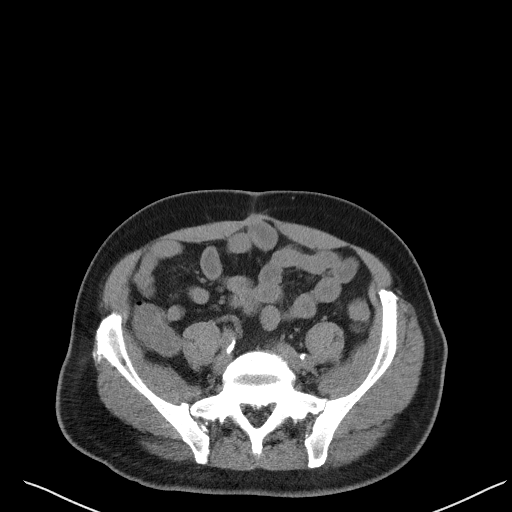
[im 47/98  soft-tissue]
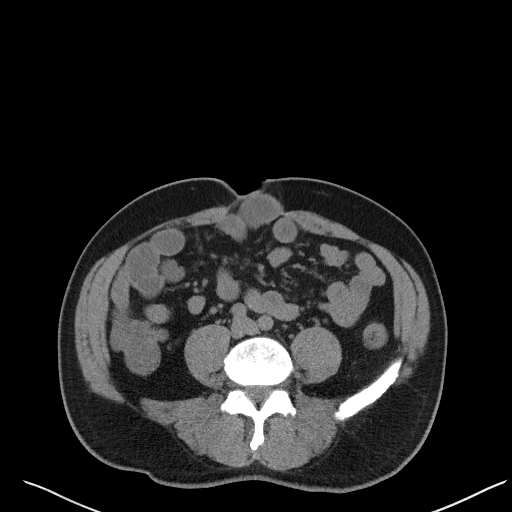
[im 55/98  soft-tissue]
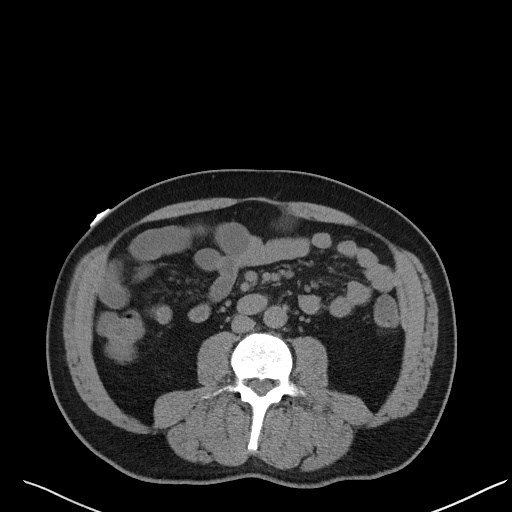
[im 63/98  soft-tissue]
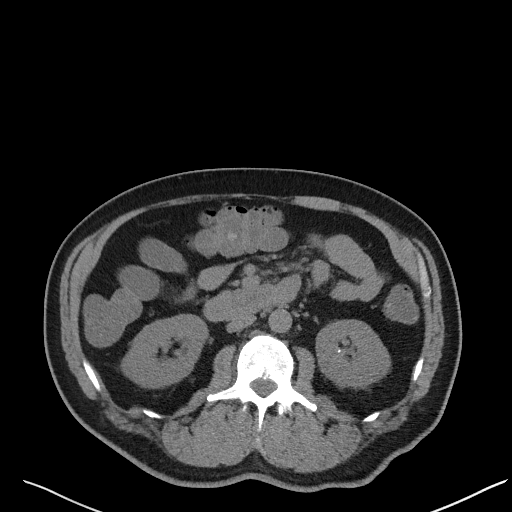
[im 70/98  soft-tissue]
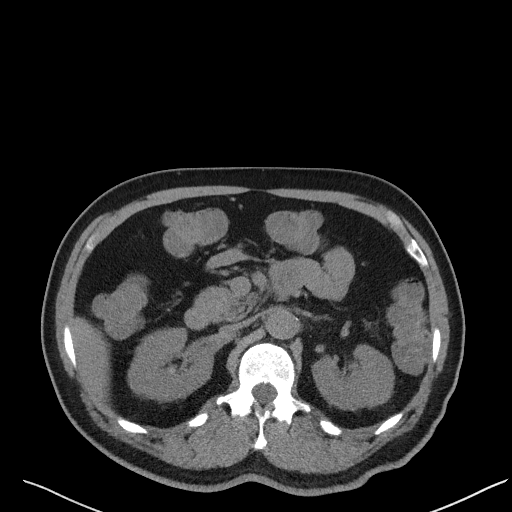
[im 70/98  bone]
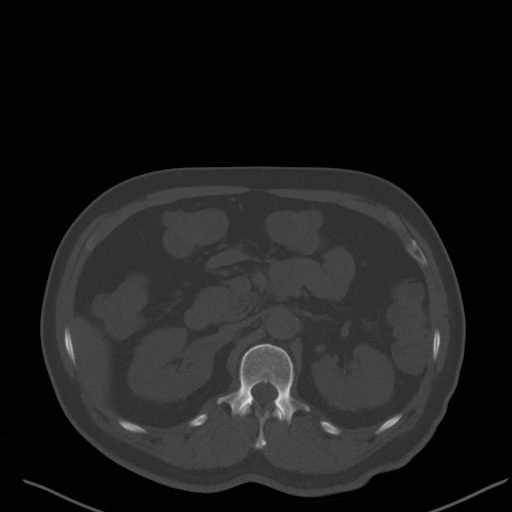
[im 78/98  soft-tissue]
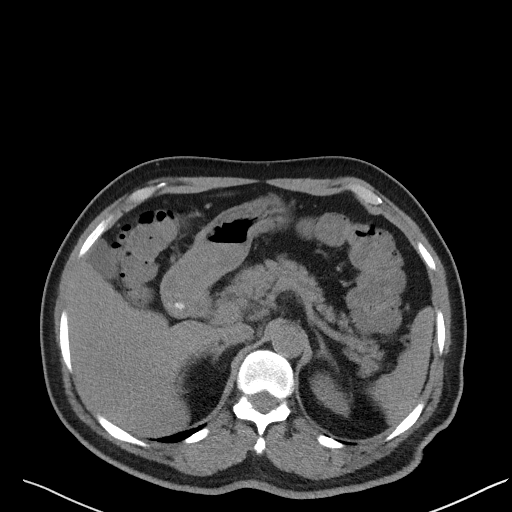
[im 86/98  soft-tissue]
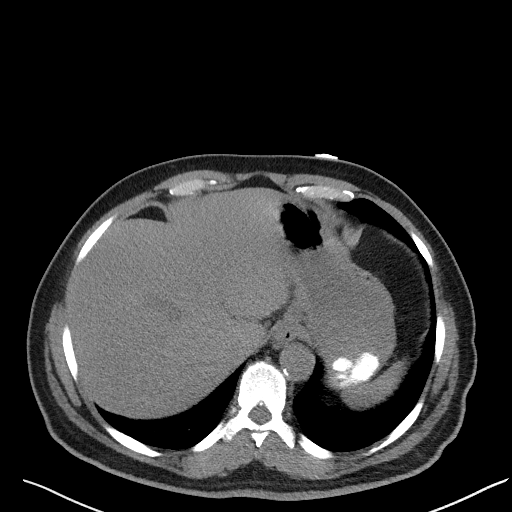
[im 94/98  soft-tissue]
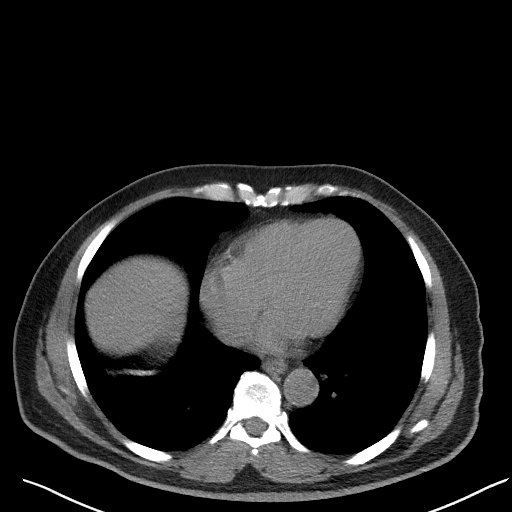

[Series 5: coronal st · coronal · 0.72mm/px · 3 of 90 slices shown]
[im 30/90  soft-tissue]
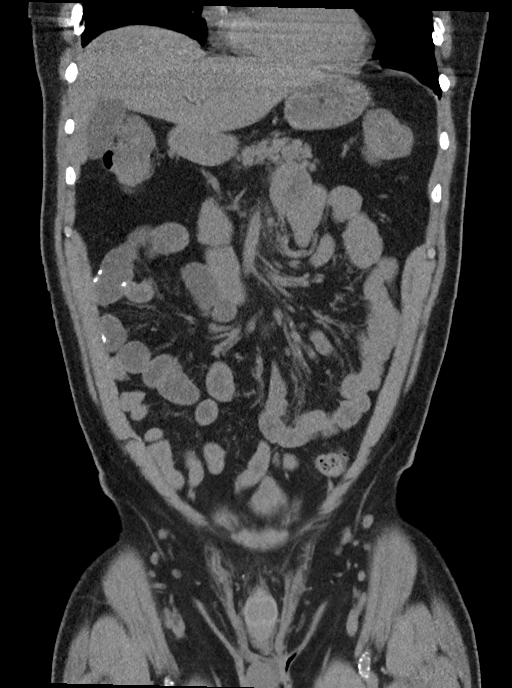
[im 40/90  soft-tissue]
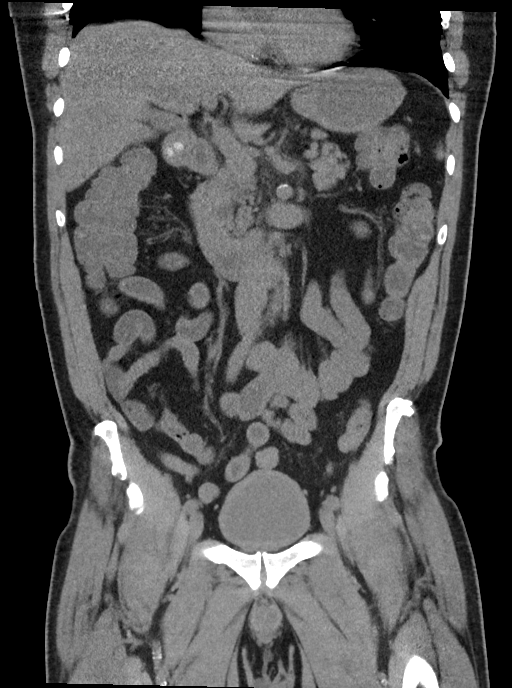
[im 50/90  soft-tissue]
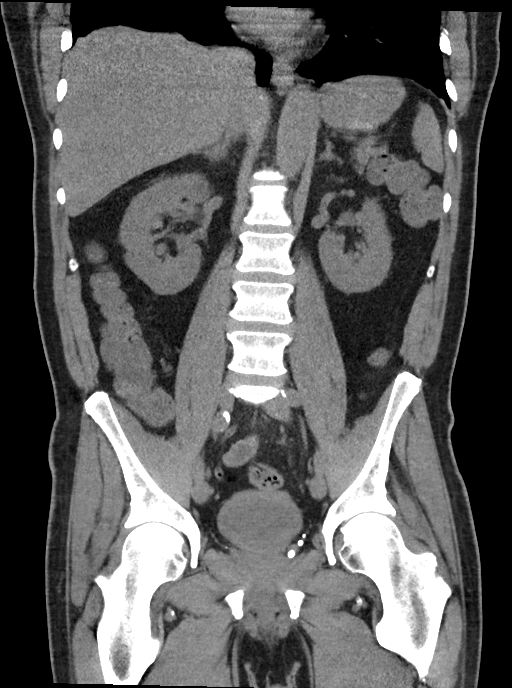

[15 of 46 positions shown; findings below may reference images not displayed]

FINDINGS: Lower chest: The lung bases are clear of an acute process. Minimal
right basilar scarring changes. The heart is normal in size. Stable
aortic and coronary artery calcifications.

Hepatobiliary: No hepatic lesions or intrahepatic biliary
dilatation. The gallbladder appears normal. No common bile duct
dilatation.

Pancreas: No mass, inflammation or ductal dilatation is appreciated
without contrast.

Spleen: Normal size.  No focal lesions.

Adrenals/Urinary Tract: The adrenal glands are unremarkable.

Bilateral renal calculi but no obstructing ureteral calculi or
bladder calculi. No worrisome renal or bladder lesions.

Stomach/Bowel: The stomach, duodenum, small bowel and colon are
unremarkable. No acute inflammatory changes, mass lesions or
obstructive findings. Terminal ileum and appendix are normal. Remote
surgical changes involving a small bowel loop in the right abdomen.

Vascular/Lymphatic: Scattered atherosclerotic calcifications
involving the aorta and branch vessels but no aneurysm. No
mesenteric or retroperitoneal mass or adenopathy.

Reproductive: The prostate gland and seminal vesicles are
unremarkable.

Other: No pelvic mass or adenopathy. No free pelvic fluid
collections. No inguinal mass or adenopathy. Small periumbilical
abdominal wall hernia containing fat and a single small bowel loop
but no findings for obstruction or incarceration.

Musculoskeletal: No significant bony findings.
IMPRESSION: 1. No acute abdominal/pelvic findings, mass lesions or adenopathy.
2. Bilateral renal calculi but no obstructing ureteral calculi or
bladder calculi.
3. Small periumbilical abdominal wall hernia containing fat and a
single small bowel loop but no findings for obstruction or
incarceration.

Aortic Atherosclerosis ([id number].K).

## 2022-07-22 ENCOUNTER — Inpatient Hospital Stay
Admission: EM | Admit: 2022-07-22 | Discharge: 2022-07-25 | DRG: 661 | Disposition: A | Discharge status: Home / Self Care | Payer: Medicare Other | Attending: Student | Admitting: Student

## 2022-07-22 ENCOUNTER — Encounter: Payer: Self-pay | Admitting: Emergency Medicine

## 2022-07-22 DIAGNOSIS — A419 Sepsis, unspecified organism: Secondary | ICD-10-CM

## 2022-07-22 DIAGNOSIS — Z7984 Long term (current) use of oral hypoglycemic drugs: Secondary | ICD-10-CM

## 2022-07-22 DIAGNOSIS — M62838 Other muscle spasm: Secondary | ICD-10-CM | POA: Diagnosis present

## 2022-07-22 DIAGNOSIS — N35919 Unspecified urethral stricture, male, unspecified site: Secondary | ICD-10-CM | POA: Diagnosis present

## 2022-07-22 DIAGNOSIS — I10 Essential (primary) hypertension: Secondary | ICD-10-CM | POA: Diagnosis present

## 2022-07-22 DIAGNOSIS — E7800 Pure hypercholesterolemia, unspecified: Secondary | ICD-10-CM | POA: Diagnosis present

## 2022-07-22 DIAGNOSIS — H548 Legal blindness, as defined in USA: Secondary | ICD-10-CM | POA: Diagnosis present

## 2022-07-22 DIAGNOSIS — Z85038 Personal history of other malignant neoplasm of large intestine: Secondary | ICD-10-CM

## 2022-07-22 DIAGNOSIS — Z8547 Personal history of malignant neoplasm of testis: Secondary | ICD-10-CM

## 2022-07-22 DIAGNOSIS — Z7982 Long term (current) use of aspirin: Secondary | ICD-10-CM

## 2022-07-22 DIAGNOSIS — N136 Pyonephrosis: Secondary | ICD-10-CM | POA: Diagnosis not present

## 2022-07-22 DIAGNOSIS — Z8546 Personal history of malignant neoplasm of prostate: Secondary | ICD-10-CM

## 2022-07-22 DIAGNOSIS — Z79899 Other long term (current) drug therapy: Secondary | ICD-10-CM

## 2022-07-22 DIAGNOSIS — N132 Hydronephrosis with renal and ureteral calculous obstruction: Secondary | ICD-10-CM

## 2022-07-22 DIAGNOSIS — E1165 Type 2 diabetes mellitus with hyperglycemia: Secondary | ICD-10-CM | POA: Diagnosis present

## 2022-07-22 DIAGNOSIS — N390 Urinary tract infection, site not specified: Secondary | ICD-10-CM | POA: Diagnosis present

## 2022-07-22 LAB — LIPASE, BLOOD: Lipase: 36 U/L (ref 11–51)

## 2022-07-22 LAB — COMPREHENSIVE METABOLIC PANEL
ALT: 24 U/L (ref 0–44)
AST: 28 U/L (ref 15–41)
Albumin: 3.9 g/dL (ref 3.5–5.0)
Alkaline Phosphatase: 71 U/L (ref 38–126)
Anion gap: 12 (ref 5–15)
BUN: 21 mg/dL (ref 8–23)
CO2: 22 mmol/L (ref 22–32)
Calcium: 8.7 mg/dL — ABNORMAL LOW (ref 8.9–10.3)
Chloride: 99 mmol/L (ref 98–111)
Creatinine, Ser: 0.89 mg/dL (ref 0.61–1.24)
GFR, Estimated: >60 mL/min (ref >60)
Glucose, Bld: 279 mg/dL — ABNORMAL HIGH (ref 70–99)
Potassium: 3.2 mmol/L — ABNORMAL LOW (ref 3.5–5.1)
Sodium: 133 mmol/L — ABNORMAL LOW (ref 135–145)
Total Bilirubin: 1 mg/dL (ref 0.3–1.2)
Total Protein: 7.2 g/dL (ref 6.5–8.1)

## 2022-07-22 LAB — URINALYSIS, ROUTINE W REFLEX MICROSCOPIC
Bilirubin Urine: NEGATIVE
Glucose, UA: >=500 mg/dL — AB
Ketones, ur: 5 mg/dL — AB
Nitrite: POSITIVE — AB
Protein, ur: 30 mg/dL — AB
Specific Gravity, Urine: 1.014 (ref 1.005–1.030)
WBC, UA: >50 WBC/hpf (ref 0–5)
pH: 5 (ref 5.0–8.0)

## 2022-07-22 LAB — LACTIC ACID, PLASMA: Lactic Acid, Venous: 1.8 mmol/L (ref 0.5–1.9)

## 2022-07-22 LAB — CBC
HCT: 34.5 % — ABNORMAL LOW (ref 39.0–52.0)
Hemoglobin: 11.5 g/dL — ABNORMAL LOW (ref 13.0–17.0)
MCH: 29.3 pg (ref 26.0–34.0)
MCHC: 33.3 g/dL (ref 30.0–36.0)
MCV: 87.8 fL (ref 80.0–100.0)
Platelets: 291 10*3/uL (ref 150–400)
RBC: 3.93 MIL/uL — ABNORMAL LOW (ref 4.22–5.81)
RDW: 14.2 % (ref 11.5–15.5)
WBC: 10.3 10*3/uL (ref 4.0–10.5)
nRBC: 0 % (ref 0.0–0.2)

## 2022-07-22 MED ORDER — ACETAMINOPHEN 500 MG PO TABS
1000.0000 mg | ORAL_TABLET | Freq: Once | ORAL | Status: AC
  Administered 2022-07-22: 1000 mg via ORAL
  Filled 2022-07-22: qty 2

## 2022-07-22 NOTE — ED Triage Notes (Signed)
BIB [facility] from home for "sick call", c/o abd pain, and NV x1. Sx onset 0700. Took tylenol this am. "Unsure of whether sx are food related or viral".  Fever 103 oral for EMS, BP 121/64, HR 125, 98% RA, CBG 281. No IV. H/o DM.

## 2022-07-22 NOTE — ED Triage Notes (Signed)
Pt presents ambulatory to triage via POV with complaints of abdominal pain with associated N/V x 1 day. Pt endorses intermittent diarrhea for the last several weeks. Pt states the pain is bilateral lower quadrants. He notes taking some Tylenol around 1500 for pain and he had a temp of 103 PTA. A&Ox4 at this time. Denies CP or SOB.

## 2022-07-23 ENCOUNTER — Encounter
Admission: EM | Disposition: A | Discharge status: Home / Self Care | Payer: Self-pay | Source: Home / Self Care | Attending: Student

## 2022-07-23 ENCOUNTER — Inpatient Hospital Stay: Payer: Medicare Other

## 2022-07-23 ENCOUNTER — Inpatient Hospital Stay: Payer: Medicare Other | Admitting: Anesthesiology

## 2022-07-23 ENCOUNTER — Emergency Department: Payer: Medicare Other

## 2022-07-23 ENCOUNTER — Encounter: Payer: Self-pay | Admitting: Internal Medicine

## 2022-07-23 ENCOUNTER — Other Ambulatory Visit: Payer: Self-pay

## 2022-07-23 DIAGNOSIS — N136 Pyonephrosis: Secondary | ICD-10-CM | POA: Diagnosis present

## 2022-07-23 DIAGNOSIS — Z79899 Other long term (current) drug therapy: Secondary | ICD-10-CM | POA: Diagnosis not present

## 2022-07-23 DIAGNOSIS — E1165 Type 2 diabetes mellitus with hyperglycemia: Secondary | ICD-10-CM | POA: Diagnosis present

## 2022-07-23 DIAGNOSIS — N390 Urinary tract infection, site not specified: Secondary | ICD-10-CM | POA: Diagnosis present

## 2022-07-23 DIAGNOSIS — N132 Hydronephrosis with renal and ureteral calculous obstruction: Secondary | ICD-10-CM

## 2022-07-23 DIAGNOSIS — A419 Sepsis, unspecified organism: Secondary | ICD-10-CM

## 2022-07-23 DIAGNOSIS — N10 Acute tubulo-interstitial nephritis: Secondary | ICD-10-CM | POA: Diagnosis not present

## 2022-07-23 DIAGNOSIS — N1330 Unspecified hydronephrosis: Secondary | ICD-10-CM | POA: Diagnosis not present

## 2022-07-23 DIAGNOSIS — H548 Legal blindness, as defined in USA: Secondary | ICD-10-CM | POA: Diagnosis present

## 2022-07-23 DIAGNOSIS — N201 Calculus of ureter: Secondary | ICD-10-CM | POA: Diagnosis not present

## 2022-07-23 DIAGNOSIS — E7800 Pure hypercholesterolemia, unspecified: Secondary | ICD-10-CM | POA: Diagnosis present

## 2022-07-23 DIAGNOSIS — Z8547 Personal history of malignant neoplasm of testis: Secondary | ICD-10-CM | POA: Diagnosis not present

## 2022-07-23 DIAGNOSIS — M62838 Other muscle spasm: Secondary | ICD-10-CM | POA: Diagnosis present

## 2022-07-23 DIAGNOSIS — Z85038 Personal history of other malignant neoplasm of large intestine: Secondary | ICD-10-CM | POA: Diagnosis not present

## 2022-07-23 DIAGNOSIS — Z8546 Personal history of malignant neoplasm of prostate: Secondary | ICD-10-CM | POA: Diagnosis not present

## 2022-07-23 DIAGNOSIS — N35919 Unspecified urethral stricture, male, unspecified site: Secondary | ICD-10-CM

## 2022-07-23 DIAGNOSIS — I10 Essential (primary) hypertension: Secondary | ICD-10-CM | POA: Diagnosis present

## 2022-07-23 DIAGNOSIS — Z7982 Long term (current) use of aspirin: Secondary | ICD-10-CM | POA: Diagnosis not present

## 2022-07-23 DIAGNOSIS — Z7984 Long term (current) use of oral hypoglycemic drugs: Secondary | ICD-10-CM | POA: Diagnosis not present

## 2022-07-23 HISTORY — PX: CYSTOSCOPY WITH STENT PLACEMENT: SHX5790

## 2022-07-23 LAB — PROCALCITONIN: Procalcitonin: 29.27 ng/mL

## 2022-07-23 LAB — CBG MONITORING, ED
Glucose-Capillary: 147 mg/dL — ABNORMAL HIGH (ref 70–99)
Glucose-Capillary: 145 mg/dL — ABNORMAL HIGH (ref 70–99)
Glucose-Capillary: 131 mg/dL — ABNORMAL HIGH (ref 70–99)

## 2022-07-23 LAB — CORTISOL-AM, BLOOD: Cortisol - AM: 20.9 ug/dL (ref 6.7–22.6)

## 2022-07-23 LAB — GLUCOSE, CAPILLARY
Glucose-Capillary: 206 mg/dL — ABNORMAL HIGH (ref 70–99)
Glucose-Capillary: 128 mg/dL — ABNORMAL HIGH (ref 70–99)
Glucose-Capillary: 225 mg/dL — ABNORMAL HIGH (ref 70–99)
Glucose-Capillary: 210 mg/dL — ABNORMAL HIGH (ref 70–99)

## 2022-07-23 LAB — PROTIME-INR
INR: 1.4 — ABNORMAL HIGH (ref 0.8–1.2)
Prothrombin Time: 17.6 seconds — ABNORMAL HIGH (ref 11.4–15.2)

## 2022-07-23 LAB — CULTURE, BLOOD (SINGLE)

## 2022-07-23 SURGERY — CYSTOSCOPY, WITH STENT INSERTION
Anesthesia: General | Laterality: Right

## 2022-07-23 MED ORDER — FENTANYL CITRATE (PF) 100 MCG/2ML IJ SOLN: 25.0000 ug | INTRAMUSCULAR | Status: DC | PRN

## 2022-07-23 MED ORDER — DEXMEDETOMIDINE HCL IN NACL 80 MCG/20ML IV SOLN
INTRAVENOUS | Status: DC | PRN
  Administered 2022-07-23: 12 ug via INTRAVENOUS

## 2022-07-23 MED ORDER — LACTATED RINGERS IV SOLN: INTRAVENOUS | Status: DC

## 2022-07-23 MED ORDER — SEVOFLURANE IN SOLN
RESPIRATORY_TRACT | Status: AC
  Filled 2022-07-23: qty 250

## 2022-07-23 MED ORDER — SODIUM CHLORIDE 0.9 % IV SOLN
2.0000 g | INTRAVENOUS | Status: DC
  Administered 2022-07-23 – 2022-07-25 (×3): 2 g via INTRAVENOUS
  Filled 2022-07-23 (×3): qty 20

## 2022-07-23 MED ORDER — LACTATED RINGERS IV SOLN
150.0000 mL/h | INTRAVENOUS | Status: DC
  Administered 2022-07-23: 150 mL/h via INTRAVENOUS

## 2022-07-23 MED ORDER — SODIUM CHLORIDE 0.9 % IV SOLN: INTRAVENOUS | Status: DC | PRN

## 2022-07-23 MED ORDER — ONDANSETRON HCL 4 MG/2ML IJ SOLN: 4.0000 mg | Freq: Four times a day (QID) | INTRAMUSCULAR | Status: DC | PRN

## 2022-07-23 MED ORDER — IOHEXOL 300 MG/ML  SOLN
100.0000 mL | Freq: Once | INTRAMUSCULAR | Status: AC | PRN
  Administered 2022-07-23: 100 mL via INTRAVENOUS

## 2022-07-23 MED ORDER — DORZOLAMIDE HCL-TIMOLOL MAL 2-0.5 % OP SOLN
1.0000 [drp] | Freq: Two times a day (BID) | OPHTHALMIC | Status: DC
  Administered 2022-07-23 – 2022-07-25 (×4): 1 [drp] via OPHTHALMIC
  Filled 2022-07-23: qty 10

## 2022-07-23 MED ORDER — PHENYLEPHRINE 80 MCG/ML (10ML) SYRINGE FOR IV PUSH (FOR BLOOD PRESSURE SUPPORT)
PREFILLED_SYRINGE | INTRAVENOUS | Status: AC
  Filled 2022-07-23: qty 10

## 2022-07-23 MED ORDER — LIDOCAINE HCL (CARDIAC) PF 100 MG/5ML IV SOSY
PREFILLED_SYRINGE | INTRAVENOUS | Status: DC | PRN
  Administered 2022-07-23: 50 mg via INTRAVENOUS

## 2022-07-23 MED ORDER — PROPOFOL 1000 MG/100ML IV EMUL
INTRAVENOUS | Status: AC
  Filled 2022-07-23: qty 100

## 2022-07-23 MED ORDER — OXYCODONE HCL 5 MG/5ML PO SOLN: 5.0000 mg | Freq: Once | ORAL | Status: DC | PRN

## 2022-07-23 MED ORDER — LIDOCAINE HCL (PF) 2 % IJ SOLN
INTRAMUSCULAR | Status: AC
  Filled 2022-07-23: qty 5

## 2022-07-23 MED ORDER — SODIUM CHLORIDE 0.9 % IR SOLN
Status: DC | PRN
  Administered 2022-07-23: 1 via INTRAVESICAL

## 2022-07-23 MED ORDER — KETOROLAC TROMETHAMINE 30 MG/ML IJ SOLN: 30.0000 mg | Freq: Four times a day (QID) | INTRAMUSCULAR | Status: DC | PRN

## 2022-07-23 MED ORDER — PROPOFOL 500 MG/50ML IV EMUL
INTRAVENOUS | Status: DC | PRN
  Administered 2022-07-23: 150 ug/kg/min via INTRAVENOUS
  Administered 2022-07-23: 30 mg via INTRAVENOUS

## 2022-07-23 MED ORDER — ACETAMINOPHEN 650 MG RE SUPP: 650.0000 mg | Freq: Four times a day (QID) | RECTAL | Status: DC | PRN

## 2022-07-23 MED ORDER — OXYCODONE HCL 5 MG PO TABS: 5.0000 mg | ORAL_TABLET | Freq: Once | ORAL | Status: DC | PRN

## 2022-07-23 MED ORDER — DEXAMETHASONE SODIUM PHOSPHATE 10 MG/ML IJ SOLN
INTRAMUSCULAR | Status: DC | PRN
  Administered 2022-07-23: 10 mg via INTRAVENOUS

## 2022-07-23 MED ORDER — SODIUM CHLORIDE 0.9 % IV SOLN: 2.0000 g | INTRAVENOUS | Status: DC

## 2022-07-23 MED ORDER — INSULIN ASPART 100 UNIT/ML IJ SOLN
0.0000 [IU] | INTRAMUSCULAR | Status: DC
  Administered 2022-07-23: 3 [IU] via SUBCUTANEOUS
  Administered 2022-07-23: 5 [IU] via SUBCUTANEOUS
  Administered 2022-07-23: 2 [IU] via SUBCUTANEOUS
  Administered 2022-07-23: 5 [IU] via SUBCUTANEOUS
  Administered 2022-07-24: 2 [IU] via SUBCUTANEOUS
  Administered 2022-07-24: 8 [IU] via SUBCUTANEOUS
  Administered 2022-07-24: 2 [IU] via SUBCUTANEOUS
  Administered 2022-07-24: 8 [IU] via SUBCUTANEOUS
  Administered 2022-07-24: 2 [IU] via SUBCUTANEOUS
  Administered 2022-07-25: 3 [IU] via SUBCUTANEOUS
  Administered 2022-07-25: 2 [IU] via SUBCUTANEOUS
  Administered 2022-07-25: 3 [IU] via SUBCUTANEOUS
  Administered 2022-07-25: 5 [IU] via SUBCUTANEOUS
  Filled 2022-07-23 (×10): qty 1

## 2022-07-23 MED ORDER — ACETAMINOPHEN 325 MG PO TABS
650.0000 mg | ORAL_TABLET | Freq: Four times a day (QID) | ORAL | Status: DC | PRN
  Administered 2022-07-23 – 2022-07-24 (×2): 650 mg via ORAL
  Filled 2022-07-23 (×2): qty 2

## 2022-07-23 MED ORDER — BRIMONIDINE TARTRATE 0.2 % OP SOLN
1.0000 [drp] | Freq: Two times a day (BID) | OPHTHALMIC | Status: DC
  Administered 2022-07-23 – 2022-07-25 (×4): 1 [drp] via OPHTHALMIC
  Filled 2022-07-23: qty 5

## 2022-07-23 MED ORDER — FENTANYL CITRATE (PF) 100 MCG/2ML IJ SOLN
INTRAMUSCULAR | Status: DC | PRN
  Administered 2022-07-23 (×2): 25 ug via INTRAVENOUS

## 2022-07-23 MED ORDER — PHENYLEPHRINE 80 MCG/ML (10ML) SYRINGE FOR IV PUSH (FOR BLOOD PRESSURE SUPPORT)
PREFILLED_SYRINGE | INTRAVENOUS | Status: DC | PRN
  Administered 2022-07-23: 160 ug via INTRAVENOUS

## 2022-07-23 MED ORDER — INSULIN ASPART 100 UNIT/ML IJ SOLN
INTRAMUSCULAR | Status: AC
  Filled 2022-07-23: qty 1

## 2022-07-23 MED ORDER — SODIUM CHLORIDE 0.9 % IV BOLUS
500.0000 mL | Freq: Once | INTRAVENOUS | Status: AC
  Administered 2022-07-23: 500 mL via INTRAVENOUS

## 2022-07-23 MED ORDER — BRIMONIDINE TARTRATE-TIMOLOL 0.2-0.5 % OP SOLN: 1.0000 [drp] | Freq: Two times a day (BID) | OPHTHALMIC | Status: DC

## 2022-07-23 MED ORDER — LATANOPROST 0.005 % OP SOLN
1.0000 [drp] | Freq: Every day | OPHTHALMIC | Status: DC
  Administered 2022-07-23 – 2022-07-24 (×2): 1 [drp] via OPHTHALMIC
  Filled 2022-07-23: qty 2.5

## 2022-07-23 MED ORDER — PROPOFOL 10 MG/ML IV BOLUS
INTRAVENOUS | Status: AC
  Filled 2022-07-23: qty 40

## 2022-07-23 MED ORDER — ONDANSETRON HCL 4 MG PO TABS: 4.0000 mg | ORAL_TABLET | Freq: Four times a day (QID) | ORAL | Status: DC | PRN

## 2022-07-23 MED ORDER — ACETAMINOPHEN 10 MG/ML IV SOLN
INTRAVENOUS | Status: AC
  Filled 2022-07-23: qty 100

## 2022-07-23 MED ORDER — ACETAMINOPHEN 10 MG/ML IV SOLN
INTRAVENOUS | Status: DC | PRN
  Administered 2022-07-23: 1000 mg via INTRAVENOUS

## 2022-07-23 MED ORDER — ONDANSETRON HCL 4 MG/2ML IJ SOLN: 4.0000 mg | Freq: Once | INTRAMUSCULAR | Status: DC | PRN

## 2022-07-23 MED ORDER — FENTANYL CITRATE (PF) 100 MCG/2ML IJ SOLN
INTRAMUSCULAR | Status: AC
  Filled 2022-07-23: qty 2

## 2022-07-23 SURGICAL SUPPLY — 25 items
BAG DRAIN SIEMENS DORNER NS (MISCELLANEOUS) ×2 IMPLANT
BAG DRN LRG CPC RND TRDRP CNTR (MISCELLANEOUS) ×2
BAG DRN NS LF (MISCELLANEOUS) ×2
BAG URO DRAIN 4000ML (MISCELLANEOUS) IMPLANT
BRUSH SCRUB EZ 1% IODOPHOR (MISCELLANEOUS) IMPLANT
CATH FOLEY 2W COUNCIL 5CC 16FR (CATHETERS) IMPLANT
CATH SET URETHRAL DILATOR (CATHETERS) IMPLANT
CATH URETL OPEN 5X70 (CATHETERS) ×2 IMPLANT
GLOVE BIOGEL PI IND STRL 7.5 (GLOVE) ×2 IMPLANT
GOWN STRL REUS W/ TWL LRG LVL3 (GOWN DISPOSABLE) ×2 IMPLANT
GOWN STRL REUS W/ TWL XL LVL3 (GOWN DISPOSABLE) ×2 IMPLANT
GOWN STRL REUS W/TWL LRG LVL3 (GOWN DISPOSABLE) ×2
GOWN STRL REUS W/TWL XL LVL3 (GOWN DISPOSABLE) ×2
GUIDEWIRE STR DUAL SENSOR (WIRE) ×2 IMPLANT
GUIDEWIRE STR ZIPWIRE 035X150 (MISCELLANEOUS) IMPLANT
IV NS IRRIG 3000ML ARTHROMATIC (IV SOLUTION) ×2 IMPLANT
KIT TURNOVER CYSTO (KITS) ×2 IMPLANT
PACK CYSTO AR (MISCELLANEOUS) ×2 IMPLANT
SET CYSTO W/LG BORE CLAMP LF (SET/KITS/TRAYS/PACK) ×2 IMPLANT
STENT URET 6FRX24 CONTOUR (STENTS) IMPLANT
STENT URET 6FRX26 CONTOUR (STENTS) IMPLANT
SURGILUBE 2OZ TUBE FLIPTOP (MISCELLANEOUS) ×2 IMPLANT
SYR TOOMEY IRRIG 70ML (MISCELLANEOUS) ×2
SYRINGE TOOMEY IRRIG 70ML (MISCELLANEOUS) IMPLANT
WATER STERILE IRR 500ML POUR (IV SOLUTION) ×2 IMPLANT

## 2022-07-23 NOTE — Assessment & Plan Note (Addendum)
No acute issues suspected Last seen by urology a couple months prior

## 2022-07-23 NOTE — ED Notes (Signed)
Pt signed consents at this time and left with OR for surgery.

## 2022-07-23 NOTE — Transfer of Care (Signed)
Immediate Anesthesia Transfer of Care Note  Patient: Dale Holt  Procedure(s) Performed: CYSTOSCOPY WITH STENT PLACEMENT (Right) URETHRAL DILATION UNDER ANESTHESIA  Patient Location: PACU  Anesthesia Type:General  Level of Consciousness: sedated and patient cooperative  Airway & Oxygen Therapy: Patient Spontanous Breathing and Patient connected to nasal cannula oxygen  Post-op Assessment: Report given to RN and Post -op Vital signs reviewed and stable  Post vital signs: Reviewed and stable  Last Vitals:  Vitals Value Taken Time  BP 107/65 07/23/22 0800  Temp 36.4 C 07/23/22 0800  Pulse 81 07/23/22 0810  Resp 14 07/23/22 0810  SpO2 100 % 07/23/22 0810  Vitals shown include unfiled device data.  Last Pain:  Vitals:   07/23/22 0800  TempSrc:   PainSc: Asleep         Complications: No notable events documented.

## 2022-07-23 NOTE — ED Notes (Signed)
Patient to CT.

## 2022-07-23 NOTE — ED Provider Notes (Signed)
[facility] Provider Note    Event Date/Time   First MD Initiated Contact with Patient 07/22/22 2351     (approximate)   History   Abdominal Pain   HPI  Dale Holt is a 72 y.o. male with a remote history of testicular cancer and colon cancer presents to the ER for evaluation of lower abdominal pain fever.  Has not had any significant dysuria or hematuria.  No flank pain.  Did have some associated nausea but no vomiting.  No diarrhea.     Physical Exam   Triage Vital Signs: ED Triage Vitals  Encounter Vitals Group     BP 07/22/22 1919 126/78     Systolic BP Percentile --      Diastolic BP Percentile --      Pulse Rate 07/22/22 1919 (!) 115     Resp 07/22/22 1919 18     Temp 07/22/22 1919 (!) 101.4 F (38.6 C)     Temp Source 07/22/22 1919 Oral     SpO2 07/22/22 1919 100 %     Weight 07/22/22 1920 143 lb (64.9 kg)     Height 07/22/22 1920 5' 10.5" (1.791 m)     Head Circumference --      Peak Flow --      Pain Score 07/22/22 1920 2     Pain Loc --      Pain Education --      Exclude from Growth Chart --     Most recent vital signs: Vitals:   07/22/22 1919 07/23/22 0024  BP: 126/78 116/67  Pulse: (!) 115 98  Resp: 18 18  Temp: (!) 101.4 F (38.6 C) 97.8 F (36.6 C)  SpO2: 100% 97%     Constitutional: Alert  Eyes: Conjunctivae are normal.  Head: Atraumatic. Nose: No congestion/rhinnorhea. Mouth/Throat: Mucous membranes are moist.   Neck: Painless ROM.  Cardiovascular:   Good peripheral circulation. Respiratory: Normal respiratory effort.  No retractions.  Gastrointestinal: Soft mild tenderness to palpation right lower quadrant. Musculoskeletal:  no deformity Neurologic:  MAE spontaneously. No gross focal neurologic deficits are appreciated.  Skin:  Skin is warm, dry and intact. No rash noted. Psychiatric: Mood and affect are normal. Speech and behavior are normal.    ED Results / Procedures / Treatments   Labs (all  labs ordered are listed, but only abnormal results are displayed) Labs Reviewed  COMPREHENSIVE METABOLIC PANEL - Abnormal; Notable for the following components:      Result Value   Sodium 133 (*)    Potassium 3.2 (*)    Glucose, Bld 279 (*)    Calcium 8.7 (*)    All other components within normal limits  CBC - Abnormal; Notable for the following components:   RBC 3.93 (*)    Hemoglobin 11.5 (*)    HCT 34.5 (*)    All other components within normal limits  URINALYSIS, ROUTINE W REFLEX MICROSCOPIC - Abnormal; Notable for the following components:   Color, Urine YELLOW (*)    APPearance CLOUDY (*)    Glucose, UA >=500 (*)    Hgb urine dipstick SMALL (*)    Ketones, ur 5 (*)    Protein, ur 30 (*)    Nitrite POSITIVE (*)    Leukocytes,Ua LARGE (*)    Bacteria, UA RARE (*)    All other components within normal limits  CULTURE, BLOOD (SINGLE)  LIPASE, BLOOD  LACTIC ACID, PLASMA     EKG  RADIOLOGY Please see ED Course for my review and interpretation.  I personally reviewed all radiographic images ordered to evaluate for the above acute complaints and reviewed radiology reports and findings.  These findings were personally discussed with the patient.  Please see medical record for radiology report.    PROCEDURES:  Critical Care performed: No  Procedures   MEDICATIONS ORDERED IN ED: Medications  cefTRIAXone (ROCEPHIN) 2 g in sodium chloride 0.9 % 100 mL IVPB (2 g Intravenous New Bag/Given 07/23/22 0018)  acetaminophen (TYLENOL) tablet 1,000 mg (1,000 mg Oral Given 07/22/22 1929)  sodium chloride 0.9 % bolus 500 mL (500 mLs Intravenous New Bag/Given 07/23/22 0018)  iohexol (OMNIPAQUE) 300 MG/ML solution 100 mL (100 mLs Intravenous Contrast Given 07/23/22 0028)     IMPRESSION / MDM / ASSESSMENT AND PLAN / ED COURSE  I reviewed the triage vital signs and the nursing notes.                              Differential diagnosis includes, but is not limited to, sepsis,  UTI, colitis, abscess, diverticulitis, appendicitis, prostatitis  Patient presenting to the ER for evaluation of symptoms as described above.  Based on symptoms, risk factors and considered above differential, this presenting complaint could reflect a potentially life-threatening illness therefore the patient will be placed on continuous pulse oximetry and telemetry for monitoring.  Laboratory evaluation will be sent to evaluate for the above complaints.  Patient febrile mildly tachycardic but nontoxic-appearing no significant leukocytosis.  Lactate normal.  Will give IV fluids.  Urinalysis with evidence of probable UTI but patient with lower abdominal pain concern for the above differential will order CT imaging.  I have ordered IV antibiotics.   Clinical Course as of 07/23/22 0151  Fri Jul 23, 2022  0131 CT imaging on my review and interpretation with approximately 7 mm right-sided stone.  His fever has resolved lactate normal.  Will consult urology.  Will have consult with hospitalist for admission for IV antibiotics.  Patient agreeable to plan. [PR]    Clinical Course User Index [PR] Willy Eddy, MD     FINAL CLINICAL IMPRESSION(S) / ED DIAGNOSES   Final diagnoses:  Sepsis, due to unspecified organism, unspecified whether acute organ dysfunction present [facility])     Rx / DC Orders   ED Discharge Orders     None        Note:  This document was prepared using Dragon voice recognition software and may include unintentional dictation errors.    Willy Eddy, MD 07/23/22 [phone]

## 2022-07-23 NOTE — Anesthesia Preprocedure Evaluation (Signed)
Anesthesia Evaluation  Patient identified by MRN, date of birth, ID band Patient awake  General Assessment Comment:  Concern for septic kidney stone. Febrile T max 101.4. patient NPO since 1pm yesterday. Only had one episode of scant vomitus yesterday, per patient. Otherwise no nausea or vomiting since.  Patient AO x 3. Does not appear ill on visual examination  Reviewed: Allergy & Precautions, NPO status , Patient's Chart, lab work & pertinent test results  History of Anesthesia Complications Negative for: history of anesthetic complications  Airway Mallampati: II  TM Distance: >3 FB Neck ROM: Full    Dental  (+) Edentulous Upper, Edentulous Lower   Pulmonary neg pulmonary ROS, neg sleep apnea, neg COPD, Patient abstained from smoking.Not current smoker   Pulmonary exam normal breath sounds clear to auscultation       Cardiovascular Exercise Tolerance: Good METShypertension, Pt. on medications (-) CAD and (-) Past MI (-) dysrhythmias  Rhythm:Regular Rate:Normal - Systolic murmurs    Neuro/Psych negative neurological ROS  negative psych ROS   GI/Hepatic ,neg GERD  ,,(+)     (-) substance abuse    Endo/Other  diabetes, Poorly Controlled, Oral Hypoglycemic Agents  Last A1c = 7.4  Renal/GU ARFRenal disease     Musculoskeletal   Abdominal   Peds  Hematology   Anesthesia Other Findings Past Medical History: No date: Anemia No date: Diabetes mellitus without complication (HCC) No date: Glaucoma No date: Hypercholesterolemia No date: Hypertension No date: Legally blind in right eye, as defined in [place] No date: Testicle cancer (HCC)  Reproductive/Obstetrics                             Anesthesia Physical Anesthesia Plan  ASA: 2 and emergent  Anesthesia Plan: General   Post-op Pain Management: Tylenol PO (pre-op)*   Induction: Intravenous  PONV Risk Score and Plan: 3 and Propofol  infusion, TIVA, Ondansetron and Treatment may vary due to age or medical condition  Airway Management Planned: Nasal Cannula  Additional Equipment: None  Intra-op Plan:   Post-operative Plan:   Informed Consent: I have reviewed the patients History and Physical, chart, labs and discussed the procedure including the risks, benefits and alternatives for the proposed anesthesia with the patient or authorized representative who has indicated his/her understanding and acceptance.     Dental advisory given  Plan Discussed with: CRNA and Surgeon  Anesthesia Plan Comments: (Only one small episode of vomiting yesterday afternoon, nothing since. No nausea today, no gastric distension. Reasonable to proceed with natural airway. Discussed risks of anesthesia with patient, including possibility of difficulty with spontaneous ventilation under anesthesia necessitating airway intervention, PONV, and rare risks such as cardiac or respiratory or neurological events, and allergic reactions. Discussed the role of CRNA in patient's perioperative care. Patient understands.)       Anesthesia Quick Evaluation

## 2022-07-23 NOTE — Plan of Care (Signed)
  Problem: Education: Goal: Ability to describe self-care measures that may prevent or decrease complications (Diabetes Survival Skills Education) will improve Outcome: Progressing   Problem: Coping: Goal: Ability to adjust to condition or change in health will improve Outcome: Progressing   Problem: Fluid Volume: Goal: Ability to maintain a balanced intake and output will improve Outcome: Progressing   Problem: Metabolic: Goal: Ability to maintain appropriate glucose levels will improve Outcome: Progressing   Problem: Nutritional: Goal: Maintenance of adequate nutrition will improve Outcome: Progressing   Problem: Education: Goal: Knowledge of General Education information will improve Description: Including pain rating scale, medication(s)/side effects and non-pharmacologic comfort measures Outcome: Progressing   Problem: Activity: Goal: Risk for activity intolerance will decrease Outcome: Progressing   Problem: Coping: Goal: Level of anxiety will decrease Outcome: Progressing   Problem: Elimination: Goal: Will not experience complications related to bowel motility Outcome: Progressing   Problem: Pain Managment: Goal: General experience of comfort will improve Outcome: Progressing   Problem: Safety: Goal: Ability to remain free from injury will improve Outcome: Progressing   Problem: Skin Integrity: Goal: Risk for impaired skin integrity will decrease Outcome: Progressing

## 2022-07-23 NOTE — Plan of Care (Signed)
Pt was seen and examined at bedside after the Right ureteral stent. Tolerated procedure well, no pain. Resting comfortably. We will continue current Abx and f/u Urine Cx and blood Cx F/u urology for dc plan in 1-2 days

## 2022-07-23 NOTE — Assessment & Plan Note (Addendum)
Sepsis Hydronephrosis secondary to obstructing ureteral calculus Continue Rocephin Sepsis fluids Keep n.p.o. for urologic procedure in the a.m. Dr. Richardo Hanks aware

## 2022-07-23 NOTE — Op Note (Signed)
Date of procedure: 07/23/22  Preoperative diagnosis:  Right ureteral stone UTI and sepsis from urinary source  Postoperative diagnosis:  Right ureteral stone UTI and sepsis from urinary source Urethral stricture   Procedure: Cystoscopy Urethral dilation Right ureteral stent placement Foley placement over wire  Surgeon: Legrand Rams, MD  Anesthesia: General  Complications: None  Intraoperative findings:  Diffuse narrowing of urethra ~60F requiring serial dilation to access bladder Small prostate Uncomplicated right ureteral stent placement  EBL: Minimal  Specimens: None  Drains: Right 18F x 26cm ureteral stent, 118F council foley  Indication: Dale Holt is a 72 y.o. patient with fever, abdominal pain, right 7mm proximal ureteral stone and concern for sepsis from urinary source.  After reviewing the management options for treatment, they elected to proceed with the above surgical procedure(s). We have discussed the potential benefits and risks of the procedure, side effects of the proposed treatment, the likelihood of the patient achieving the goals of the procedure, and any potential problems that might occur during the procedure or recuperation. Informed consent has been obtained.  Description of procedure:  The patient was taken to the operating room and general anesthesia was induced. SCDs were placed for DVT prophylaxis. The patient was placed in the dorsal lithotomy position, prepped and draped in the usual sterile fashion, and preoperative antibiotics(ceftriaxone in ED) were administered. A preoperative time-out was performed.   A 94F rigid cystoscope was used to intubate the urethra, and by the mid urethra diffuse narrowing down to 60F was noted and unable to pass scope into the bladder.   A sensor wire was passed through the scope into the bladder. Serial dilation with heyman dilators was performed from 60F to 58F. I was then able to enter the bladder with the  scope, the prostate was small. Thorough cystoscopy showed no abnormal findings.  A super slick wire was passed into the right ureteral orifice, alongside the right proximal ureteral stone, and into the right kidney. A 18F x 26cm stent advanced easily over the wire with a curl in the mid pole, as well as under direct vision in the bladder. Cloudy urine drained through the side ports of the stent.  A 118F council foley was passed over the sensor wire into the bladder with return of clear urine. 10ml placed in the balloon, and foley connected to drainage.  Disposition: Stable to PACU  Plan: Plan foley removal Sunday or Monday  Schedule outpatient right ureteroscopy/laser/stent in 2-4 weeks  Legrand Rams, MD

## 2022-07-23 NOTE — Anesthesia Postprocedure Evaluation (Signed)
Anesthesia Post Note  Patient: Dale Holt  Procedure(s) Performed: CYSTOSCOPY WITH STENT PLACEMENT (Right) URETHRAL DILATION UNDER ANESTHESIA  Patient location during evaluation: PACU Anesthesia Type: General Level of consciousness: awake and alert Pain management: pain level controlled Vital Signs Assessment: post-procedure vital signs reviewed and stable Respiratory status: spontaneous breathing, nonlabored ventilation, respiratory function stable and patient connected to nasal cannula oxygen Cardiovascular status: blood pressure returned to baseline and stable Postop Assessment: no apparent nausea or vomiting Anesthetic complications: no   No notable events documented.   Last Vitals:  Vitals:   07/23/22 1100 07/23/22 1157  BP: 112/71 113/66  Pulse: 76 78  Resp: 18 18  Temp: (!) 36.3 C 37.1 C  SpO2: 100% 100%    Last Pain:  Vitals:   07/23/22 0915  TempSrc:   PainSc: Asleep                 Corinda Gubler

## 2022-07-23 NOTE — H&P (Signed)
History and Physical    Patient: Dale Holt MWN:[id number] DOB: 06-05-1950 DOA: 07/22/2022 DOS: the patient was seen and examined on 07/23/2022 PCP: Marisue Ivan, MD  Patient coming from: Home  Chief Complaint:  Chief Complaint  Patient presents with   Abdominal Pain    HPI: Dale Holt is a 72 y.o. male with medical history significant for Diabetes, hypertension, remote history of testicular cancer, followed by urology and history of colon cancer who presents to the ED with lower abdominal pain, fever and chills.  Symptoms started suddenly during the course of the day.  He has had no nausea or vomiting or abdominal pain or diarrhea or dysuria.  Most of the history given by his wife at the bedside. ED course and the data review: Tmax of 101.4 and tachycardic to 115 on arrival with otherwise normal vitals.  Labs with WBC 10.3, hemoglobin 11.5, lactic acid 1.8.  Glucose 279, sodium 133 and potassium 3.2.  Urinalysis consistent with UTI with positive nitrites and large leukocyte esterase rare bacteria and WBCs over 50. CT abdomen and pelvis, interpreted by ED provider showed an obstructing 7 mm ureteral stone. The ED provider: Discussed findings with on-call urologist, Dr. Irish Elders who recommended IV antibiotics and admission to hospitalist service and to keep n.p.o. for possible procedure in the a.m. Hospitalist consulted for admission.   Review of Systems: As mentioned in the history of present illness. All other systems reviewed and are negative.  Past Medical History:  Diagnosis Date   Anemia    Diabetes mellitus without complication (HCC)    Glaucoma    Hypercholesterolemia    Hypertension    Legally blind in right eye, as defined in [place]    Testicle cancer [facility])    Past Surgical History:  Procedure Laterality Date   COLONOSCOPY WITH PROPOFOL N/A 04/13/2016   Procedure: COLONOSCOPY WITH PROPOFOL;  Surgeon: Christena Deem, MD;  Location: [facility] ENDOSCOPY;  Service:  Endoscopy;  Laterality: N/A;   EYE SURGERY     teticle surgery     Social History:  reports that he has never smoked. He has never used smokeless tobacco. He reports current alcohol use. He reports that he does not use drugs.  No Known Allergies  History reviewed. No pertinent family history.  Prior to Admission medications   Medication Sig Start Date End Date Taking? Authorizing Provider  amLODipine-atorvastatin (CADUET) 5-10 MG tablet Take 1 tablet by mouth daily.    [provider]  aspirin EC 81 MG tablet Take 81 mg by mouth daily.    [provider]  brimonidine (ALPHAGAN) 0.2 % ophthalmic solution Place into both eyes 2 (two) times daily.    [provider]  brimonidine-timolol (COMBIGAN) 0.2-0.5 % ophthalmic solution Place 1 drop into both eyes 2 (two) times daily.    [provider]  dorzolamide-timolol (COSOPT) 22.3-6.8 MG/ML ophthalmic solution 1 drop 2 (two) times daily. 09/04/18   [provider]  glipiZIDE (GLUCOTROL) 10 MG tablet Take 20 mg by mouth 2 (two) times daily with a meal.    [provider]  latanoprost (XALATAN) 0.005 % ophthalmic solution Place 1 drop into both eyes at bedtime.    [provider]  lidocaine (LIDODERM) 5 % Place 1 patch onto the skin daily. Remove & Discard patch within 12 hours or as directed by MD 12/19/20   Rushie Chestnut, PA-C  metFORMIN (GLUCOPHAGE) 1000 MG tablet Take 1,000 mg by mouth 2 (two) times daily with a  meal.    [provider]  Multiple Vitamin (MULTIVITAMIN) tablet Take 1 tablet by mouth daily.    [provider]  ondansetron (ZOFRAN ODT) 4 MG disintegrating tablet Take 1 tablet (4 mg total) by mouth every 8 (eight) hours as needed for nausea or vomiting. 05/19/20   Delton Prairie, MD  ramipril (ALTACE) 5 MG capsule Take 5 mg by mouth daily.    [provider]  sildenafil (VIAGRA) 100 MG tablet Take 1 tablet (100 mg total) by mouth daily as  needed for erectile dysfunction. 01/22/21   Sondra Come, MD  tiZANidine (ZANAFLEX) 4 MG tablet Take 1 tablet (4 mg total) by mouth every 6 (six) hours as needed for muscle spasms. 12/19/20   Rushie Chestnut, PA-C    Physical Exam: Vitals:   07/22/22 1919 07/22/22 1920 07/23/22 0024  BP: 126/78  116/67  Pulse: (!) 115  98  Resp: 18  18  Temp: (!) 101.4 F (38.6 C)  97.8 F (36.6 C)  TempSrc: Oral  Oral  SpO2: 100%  97%  Weight:  64.9 kg   Height:  5' 10.5" (1.791 m)    Physical Exam  Labs on Admission: I have personally reviewed following labs and imaging studies  CBC: Recent Labs  Lab 07/22/22 1924  WBC 10.3  HGB 11.5*  HCT 34.5*  MCV 87.8  PLT 291   Basic Metabolic Panel: Recent Labs  Lab 07/22/22 1924  NA 133*  K 3.2*  CL 99  CO2 22  GLUCOSE 279*  BUN 21  CREATININE 0.89  CALCIUM 8.7*   GFR: Estimated Creatinine Clearance: 67.9 mL/min (by C-G formula based on SCr of 0.89 mg/dL). Liver Function Tests: Recent Labs  Lab 07/22/22 1924  AST 28  ALT 24  ALKPHOS 71  BILITOT 1.0  PROT 7.2  ALBUMIN 3.9   Recent Labs  Lab 07/22/22 1924  LIPASE 36   No results for input(s): "AMMONIA" in the last 168 hours. Coagulation Profile: No results for input(s): "INR", "PROTIME" in the last 168 hours. Cardiac Enzymes: No results for input(s): "CKTOTAL", "CKMB", "CKMBINDEX", "TROPONINI" in the last 168 hours. BNP (last 3 results) No results for input(s): "PROBNP" in the last 8760 hours. HbA1C: No results for input(s): "HGBA1C" in the last 72 hours. CBG: No results for input(s): "GLUCAP" in the last 168 hours. Lipid Profile: No results for input(s): "CHOL", "HDL", "LDLCALC", "TRIG", "CHOLHDL", "LDLDIRECT" in the last 72 hours. Thyroid Function Tests: No results for input(s): "TSH", "T4TOTAL", "FREET4", "T3FREE", "THYROIDAB" in the last 72 hours. Anemia Panel: No results for input(s): "VITAMINB12", "FOLATE", "FERRITIN", "TIBC", "IRON", "RETICCTPCT" in  the last 72 hours. Urine analysis:    Component Value Date/Time   COLORURINE YELLOW (A) 07/22/2022 1921   APPEARANCEUR CLOUDY (A) 07/22/2022 1921   LABSPEC 1.014 07/22/2022 1921   PHURINE 5.0 07/22/2022 1921   GLUCOSEU >=500 (A) 07/22/2022 1921   HGBUR SMALL (A) 07/22/2022 1921   BILIRUBINUR NEGATIVE 07/22/2022 1921   KETONESUR 5 (A) 07/22/2022 1921   PROTEINUR 30 (A) 07/22/2022 1921   NITRITE POSITIVE (A) 07/22/2022 1921   LEUKOCYTESUR LARGE (A) 07/22/2022 1921    Radiological Exams on Admission: No results found.   Data Reviewed: Relevant notes from primary care and specialist visits, past discharge summaries as available in EHR, including Care Everywhere. Prior diagnostic testing as pertinent to current admission diagnoses Updated medications and problem lists for reconciliation ED course, including vitals, labs, imaging, treatment and response to treatment Triage notes, nursing  and pharmacy notes and ED provider's notes Notable results as noted in HPI   Assessment and Plan: * UTI (urinary tract infection) Sepsis Hydronephrosis secondary to obstructing ureteral calculus Continue Rocephin Sepsis fluids Keep n.p.o. for urologic procedure in the a.m. Dr. Richardo Hanks aware   History of testicular cancer No acute issues suspected Last seen by urology a couple months prior  Uncontrolled type 2 diabetes mellitus with hyperglycemia, without long-term current use of insulin (HCC) Blood sugar 279 Sliding scale insulin coverage.  Essential hypertension Holding home antihypertensives in the setting of sepsis.  normotensive        DVT prophylaxis: SCD  Consults: urology, Dr Richardo Hanks  Advance Care Planning:   Code Status: Full Code   Family Communication: wife at bedside  Disposition Plan: Back to previous home environment  Severity of Illness: The appropriate patient status for this patient is INPATIENT. Inpatient status is judged to be reasonable and necessary in  order to provide the required intensity of service to ensure the patient's safety. The patient's presenting symptoms, physical exam findings, and initial radiographic and laboratory data in the context of their chronic comorbidities is felt to place them at high risk for further clinical deterioration. Furthermore, it is not anticipated that the patient will be medically stable for discharge from the hospital within 2 midnights of admission.   * I certify that at the point of admission it is my clinical judgment that the patient will require inpatient hospital care spanning beyond 2 midnights from the point of admission due to high intensity of service, high risk for further deterioration and high frequency of surveillance required.*  Author: Andris Baumann, MD 07/23/2022 2:48 AM  For on call review [email or web address].

## 2022-07-23 NOTE — Assessment & Plan Note (Signed)
Holding home antihypertensives in the setting of sepsis.  normotensive

## 2022-07-23 NOTE — Consult Note (Signed)
Urology Consult   I have been asked to see the patient by Dr. Roxan Hockey, for evaluation and management of infected right ureteral stone.  Chief Complaint: right ureteral stone and sepsis  HPI:  Dale Holt is a 72 y.o. 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 Urinalysis appeared grossly infected. Antibiotics started, fluids given, and urology consulted.  PMH: Past Medical History:  Diagnosis Date   Anemia    Diabetes mellitus without complication (HCC)    Glaucoma    Hypercholesterolemia    Hypertension    Legally blind in right eye, as defined in [place]    Testicle cancer [facility])     Surgical History: Past Surgical History:  Procedure Laterality Date   COLONOSCOPY WITH PROPOFOL N/A 04/13/2016   Procedure: COLONOSCOPY WITH PROPOFOL;  Surgeon: Christena Deem, MD;  Location: [facility] ENDOSCOPY;  Service: Endoscopy;  Laterality: N/A;   EYE SURGERY     teticle surgery       Allergies: No Known Allergies  Family History: History reviewed. No pertinent family history.  Social History:  reports that he has never smoked. He has never used smokeless tobacco. He reports current alcohol use. He reports that he does not use drugs.  Physical Exam: BP 124/81   Pulse 98   Temp 97.8 F (36.6 C) (Temporal)   Resp 16   Ht 5' 10.5" (1.791 m)   Wt 64.9 kg   SpO2 95%   BMI 20.23 kg/m    Constitutional:  Alert and oriented, No acute distress. Cardiovascular: RRR Respiratory: CTA bilaterally GI: Abdomen is soft, nontender, nondistended, no abdominal masses   Laboratory Data: Reviewed in EPIC  Pertinent Imaging: I have personally reviewed the CT showing a 7mm right proximal ureteral stone and hydronephrosis.  Assessment & Plan:   72 YO M with low risk PCa on active surveillance, presents with fever and abdominal pain and found to have 7mm right proximal ureteral stone with  hydronephrosis, grossly infected urine, and concern for sepsis from urinary source.  We discussed the need for drainage in the setting of an infected and obstructed system.  A ureteral stent is a small plastic tube that is placed cystoscopically with one end in the kidney and the other end in the bladder that allows the infection from the kidney to drain, and relieves pain from the obstructing stone.  We discussed the risks at length including bleeding, infection, sepsis, death, ureteral injury, and stent related symptoms including urgency/frequency/dysuria/flank pain/gross hematuria.  There is a low, but not 0, risk of inability to pass the ureteral stent alongside the stone from below which would require percutaneous nephrostomy tube by interventional radiology.  Finally, we discussed possible prolonged hospitalization and recovery, possible temporary Foley catheter placement, and 10 to 14-day course of antibiotics.  We reviewed the need for a follow-up procedure for definitive management of their stone when the infection has been treated in 2 to 3 weeks with ureteroscopy/laser lithotripsy.  Recommendations: Cystoscopy and right ureteral stent placement today Continue antibiotics  Sondra Come, MD  Total time spent on the floor was 60 minutes, with greater than 50% spent in counseling and coordination of care with the patient regarding infected right ureteral stone and need for drainage with ureteral stent, followed by delayed ureteroscopy once infection treated.  [facility] [address], [address] [place], [place] [address] [phone]

## 2022-07-23 NOTE — Assessment & Plan Note (Signed)
Blood sugar 279 Sliding scale insulin coverage.

## 2022-07-24 ENCOUNTER — Encounter: Payer: Self-pay | Admitting: Urology

## 2022-07-24 DIAGNOSIS — N10 Acute tubulo-interstitial nephritis: Secondary | ICD-10-CM

## 2022-07-24 LAB — CULTURE, BLOOD (SINGLE): Culture: NO GROWTH

## 2022-07-24 LAB — BASIC METABOLIC PANEL
Anion gap: 9 (ref 5–15)
BUN: 12 mg/dL (ref 8–23)
CO2: 23 mmol/L (ref 22–32)
Calcium: 8.1 mg/dL — ABNORMAL LOW (ref 8.9–10.3)
Chloride: 105 mmol/L (ref 98–111)
Creatinine, Ser: 0.71 mg/dL (ref 0.61–1.24)
GFR, Estimated: >60 mL/min (ref >60)
Glucose, Bld: 166 mg/dL — ABNORMAL HIGH (ref 70–99)
Potassium: 3 mmol/L — ABNORMAL LOW (ref 3.5–5.1)
Sodium: 137 mmol/L (ref 135–145)

## 2022-07-24 LAB — CBC
HCT: 32.2 % — ABNORMAL LOW (ref 39.0–52.0)
Hemoglobin: 10.8 g/dL — ABNORMAL LOW (ref 13.0–17.0)
MCH: 29.1 pg (ref 26.0–34.0)
MCHC: 33.5 g/dL (ref 30.0–36.0)
MCV: 86.8 fL (ref 80.0–100.0)
Platelets: 213 10*3/uL (ref 150–400)
RBC: 3.71 MIL/uL — ABNORMAL LOW (ref 4.22–5.81)
RDW: 14.4 % (ref 11.5–15.5)
WBC: 11.6 10*3/uL — ABNORMAL HIGH (ref 4.0–10.5)
nRBC: 0 % (ref 0.0–0.2)

## 2022-07-24 LAB — MAGNESIUM: Magnesium: 1.9 mg/dL (ref 1.7–2.4)

## 2022-07-24 LAB — URINE CULTURE

## 2022-07-24 LAB — HEMOGLOBIN A1C
Hgb A1c MFr Bld: 8 % — ABNORMAL HIGH (ref 4.8–5.6)
Mean Plasma Glucose: 183 mg/dL

## 2022-07-24 LAB — GLUCOSE, CAPILLARY
Glucose-Capillary: 147 mg/dL — ABNORMAL HIGH (ref 70–99)
Glucose-Capillary: 149 mg/dL — ABNORMAL HIGH (ref 70–99)
Glucose-Capillary: 149 mg/dL — ABNORMAL HIGH (ref 70–99)
Glucose-Capillary: 162 mg/dL — ABNORMAL HIGH (ref 70–99)
Glucose-Capillary: 256 mg/dL — ABNORMAL HIGH (ref 70–99)
Glucose-Capillary: 260 mg/dL — ABNORMAL HIGH (ref 70–99)

## 2022-07-24 LAB — PHOSPHORUS: Phosphorus: 3.3 mg/dL (ref 2.5–4.6)

## 2022-07-24 MED ORDER — LACTATED RINGERS IV SOLN: INTRAVENOUS | Status: DC

## 2022-07-24 MED ORDER — POTASSIUM CHLORIDE CRYS ER 20 MEQ PO TBCR
40.0000 meq | EXTENDED_RELEASE_TABLET | Freq: Once | ORAL | Status: AC
  Administered 2022-07-24: 40 meq via ORAL
  Filled 2022-07-24: qty 2

## 2022-07-24 MED ORDER — POTASSIUM CHLORIDE 10 MEQ/100ML IV SOLN
10.0000 meq | INTRAVENOUS | Status: AC
  Administered 2022-07-24 (×4): 10 meq via INTRAVENOUS
  Filled 2022-07-24 (×3): qty 100

## 2022-07-24 MED ORDER — CHLORHEXIDINE GLUCONATE CLOTH 2 % EX PADS
6.0000 | MEDICATED_PAD | Freq: Every day | CUTANEOUS | Status: DC
  Administered 2022-07-24 – 2022-07-25 (×2): 6 via TOPICAL

## 2022-07-24 NOTE — Progress Notes (Signed)
Mobility Specialist - Progress Note   07/24/22 0937  Mobility  Activity Ambulated with assistance in hallway;Stood at bedside;Dangled on edge of bed  Level of Assistance Standby assist, set-up cues, supervision of patient - no hands on  Assistive Device None  Distance Ambulated (ft) 340 ft  Activity Response Tolerated well  Mobility Referral Yes  $Mobility charge 1 Mobility  Mobility Specialist Start Time (ACUTE ONLY) 0911  Mobility Specialist Stop Time (ACUTE ONLY) [id number]  Mobility Specialist Time Calculation (min) (ACUTE ONLY) 12 min   Pt supine in bed on RA upon arrival. Pt completes bed mobility, STS, and ambulates two laps SBA with no LOB noted. Pt returns to bed with needs in reach and NT in room.   Terrilyn Saver  Mobility Specialist  07/24/22 9:39 AM

## 2022-07-24 NOTE — Progress Notes (Addendum)
[facility] Progress Note  Patient: Dale Holt    Dale Holt  DOA: 07/22/2022     Date of Service: the patient was seen and examined on 07/24/2022  Chief Complaint  Patient presents with   Abdominal Pain   Brief hospital course: Dale Holt is a 72 y.o. male with medical history significant for Diabetes, hypertension, remote history of testicular cancer, followed by urology and history of colon cancer who presents to the ED with lower abdominal pain, fever and chills.  Symptoms started suddenly during the course of the day.  He has had no nausea or vomiting or abdominal pain or diarrhea or dysuria.  Most of the history given by his wife at the bedside. ED course and the data review: Tmax of 101.4 and tachycardic to 115 on arrival with otherwise normal vitals.  Labs with WBC 10.3, hemoglobin 11.5, lactic acid 1.8.  Glucose 279, sodium 133 and potassium 3.2.  Urinalysis consistent with UTI with positive nitrites and large leukocyte esterase rare bacteria and WBCs over 50. CT abdomen and pelvis, interpreted by ED provider showed an obstructing 7 mm ureteral stone. The ED provider: Discussed findings with on-call urologist, Dr. Irish Elders who recommended IV antibiotics and admission to hospitalist service and to keep n.p.o. for possible procedure in the a.m. Hospitalist consulted for admission.    Assessment and Plan: # UTI (urinary tract infection) # Sepsis ruled out # Hydronephrosis secondary to obstructing ureteral calculus S/p IV fluid given for hydration Continue Rocephin Urology consulted, Dr. Richardo Hanks, s/p right ureteral stent insertion done on 7/19 Urology recommended antibiotics for 2 weeks, keep Foley till Sunday and follow-up as an outpatient in 2 to 3 weeks for definitive treatment. Blood culture NGTD, Urine culture growing gram-negative rods, follow ID and susceptibility report    # History of testicular cancer No acute issues suspected Last seen by urology a  couple months prior   # Uncontrolled type 2 diabetes mellitus with hyperglycemia, without long-term current use of insulin Held home medications for now Continue sliding scale insulin coverage. Continue diabetic diet, monitor CBG   # Essential hypertension Held home medications amlodipine and ramipril due to concern of sepsis which has been ruled out. Patient is normotensive Monitor BP and resume medications if needed  Body mass index is 20.23 kg/m.  Interventions:  Diet: Carb modified diet DVT Prophylaxis: SCD, pharmacological prophylaxis contraindicated due to risk of bleeding    Advance goals of care discussion: Full code  Family Communication: family was present at bedside, at the time of interview.  The pt provided permission to discuss medical plan with the family. Opportunity was given to ask question and all questions were answered satisfactorily.   Disposition:  Pt is from Home, admitted with right ureteral stone status post stent insertion, still has Foley and IV antibiotics, which precludes a safe discharge. Discharge to home, when when cleared by urology, most likely tomorrow a.m.  Subjective: No significant events overnight, patient denies any pain right now, patient had some bleeding from urethra surrounding the Foley catheter which has been stopped possible traumatic. Patient denies any abdominal pain, no nausea vomiting, no chest pain or palpitation, no shortness of breath  Physical Exam: General: NAD, lying comfortably Appear in no distress, affect appropriate Eyes: PERRLA ENT: Oral Mucosa Clear, moist  Neck: no JVD,  Cardiovascular: S1 and S2 Present, no Murmur,  Respiratory: good respiratory effort, Bilateral Air entry equal and Decreased, no Crackles, no wheezes Abdomen: Bowel Sound present, Soft and no tenderness,  Skin: no rashes Extremities: no Pedal edema, no calf tenderness Neurologic: without any new focal findings Gait not checked due to patient  safety concerns  Vitals:   07/23/22 1948 07/24/22 0424 07/24/22 0740 07/24/22 1156  BP: (!) 144/76 130/81 131/82 127/64  Pulse: 85 72 71 72  Resp: 20 16 17 17   Temp: 98.8 F (37.1 C) 97.9 F (36.6 C) 98.1 F (36.7 C) 98.2 F (36.8 C)  TempSrc: Oral Oral Oral   SpO2: 100% 100% 100% 94%  Weight:      Height:        Intake/Output Summary (Last 24 hours) at 07/24/2022 1351 Last data filed at 07/24/2022 8295 Gross per 24 hour  Intake 680 ml  Output 2075 ml  Net -1395 ml   Filed Weights   07/22/22 1920  Weight: 64.9 kg    Data Reviewed: I have personally reviewed and interpreted daily labs, tele strips, imagings as discussed above. I reviewed all nursing notes, pharmacy notes, vitals, pertinent old records I have discussed plan of care as described above with RN and patient/family.  CBC: Recent Labs  Lab 07/22/22 1924 07/24/22 0450  WBC 10.3 11.6*  HGB 11.5* 10.8*  HCT 34.5* 32.2*  MCV 87.8 86.8  PLT 291 213   Basic Metabolic Panel: Recent Labs  Lab 07/22/22 1924 07/24/22 0450  NA 133* 137  K 3.2* 3.0*  CL 99 105  CO2 22 23  GLUCOSE 279* 166*  BUN 21 12  CREATININE 0.89 0.71  CALCIUM 8.7* 8.1*  MG  --  1.9  PHOS  --  3.3    Studies: No results found.  Scheduled Meds:  brimonidine  1 drop Both Eyes BID   Chlorhexidine Gluconate Cloth  6 each Topical Q0600   dorzolamide-timolol  1 drop Both Eyes BID   insulin aspart  0-15 Units Subcutaneous Q4H   latanoprost  1 drop Both Eyes QHS   potassium chloride  40 mEq Oral Once   Continuous Infusions:  cefTRIAXone (ROCEPHIN)  IV 2 g (07/23/22 2329)   lactated ringers 75 mL/hr at 07/23/22 2334   PRN Meds: acetaminophen **OR** acetaminophen, ketorolac, ondansetron **OR** ondansetron (ZOFRAN) IV  Time spent: 35 minutes  Author: Gillis Santa. MD [facility] 07/24/2022 1:51 PM  To reach On-call, see care teams to locate the attending and reach out to them via [email or web address]. If 7PM-7AM, please contact  night-coverage If you still have difficulty reaching the attending provider, please page the [facility] (Director on Call) for [facility] on amion for assistance.

## 2022-07-25 DIAGNOSIS — N10 Acute tubulo-interstitial nephritis: Secondary | ICD-10-CM | POA: Diagnosis not present

## 2022-07-25 LAB — PHOSPHORUS: Phosphorus: 4.2 mg/dL (ref 2.5–4.6)

## 2022-07-25 LAB — CBC
HCT: 32.6 % — ABNORMAL LOW (ref 39.0–52.0)
Hemoglobin: 11.1 g/dL — ABNORMAL LOW (ref 13.0–17.0)
MCH: 29.3 pg (ref 26.0–34.0)
MCHC: 34 g/dL (ref 30.0–36.0)
MCV: 86 fL (ref 80.0–100.0)
Platelets: 237 10*3/uL (ref 150–400)
RBC: 3.79 MIL/uL — ABNORMAL LOW (ref 4.22–5.81)
RDW: 14.5 % (ref 11.5–15.5)
WBC: 9.3 10*3/uL (ref 4.0–10.5)
nRBC: 0 % (ref 0.0–0.2)

## 2022-07-25 LAB — GLUCOSE, CAPILLARY
Glucose-Capillary: 162 mg/dL — ABNORMAL HIGH (ref 70–99)
Glucose-Capillary: 140 mg/dL — ABNORMAL HIGH (ref 70–99)
Glucose-Capillary: 203 mg/dL — ABNORMAL HIGH (ref 70–99)

## 2022-07-25 LAB — BASIC METABOLIC PANEL
Anion gap: 8 (ref 5–15)
BUN: 10 mg/dL (ref 8–23)
CO2: 23 mmol/L (ref 22–32)
Calcium: 8.8 mg/dL — ABNORMAL LOW (ref 8.9–10.3)
Chloride: 105 mmol/L (ref 98–111)
Creatinine, Ser: 0.65 mg/dL (ref 0.61–1.24)
GFR, Estimated: >60 mL/min (ref >60)
Glucose, Bld: 151 mg/dL — ABNORMAL HIGH (ref 70–99)
Potassium: 3.5 mmol/L (ref 3.5–5.1)
Sodium: 136 mmol/L (ref 135–145)

## 2022-07-25 LAB — URINE CULTURE: Culture: >=100000 — AB

## 2022-07-25 LAB — MAGNESIUM: Magnesium: 1.9 mg/dL (ref 1.7–2.4)

## 2022-07-25 MED ORDER — CIPROFLOXACIN HCL 500 MG PO TABS: 500.0000 mg | ORAL_TABLET | Freq: Two times a day (BID) | ORAL | 0 refills | Status: AC

## 2022-07-25 MED ORDER — CIPROFLOXACIN HCL 500 MG PO TABS
500.0000 mg | ORAL_TABLET | Freq: Two times a day (BID) | ORAL | Status: DC
  Administered 2022-07-25: 500 mg via ORAL
  Filled 2022-07-25: qty 1

## 2022-07-25 NOTE — Plan of Care (Signed)
  Problem: Education: Goal: Ability to describe self-care measures that may prevent or decrease complications (Diabetes Survival Skills Education) will improve Outcome: Progressing   Problem: Coping: Goal: Ability to adjust to condition or change in health will improve Outcome: Progressing   Problem: Fluid Volume: Goal: Ability to maintain a balanced intake and output will improve Outcome: Progressing   Problem: Metabolic: Goal: Ability to maintain appropriate glucose levels will improve Outcome: Progressing   Problem: Nutritional: Goal: Maintenance of adequate nutrition will improve Outcome: Progressing   Problem: Skin Integrity: Goal: Risk for impaired skin integrity will decrease Outcome: Progressing   Problem: Tissue Perfusion: Goal: Adequacy of tissue perfusion will improve Outcome: Progressing   Problem: Respiratory: Goal: Ability to maintain adequate ventilation will improve Outcome: Progressing   Problem: Activity: Goal: Risk for activity intolerance will decrease Outcome: Progressing   Problem: Nutrition: Goal: Adequate nutrition will be maintained Outcome: Progressing   Problem: Coping: Goal: Level of anxiety will decrease Outcome: Progressing   Problem: Pain Managment: Goal: General experience of comfort will improve Outcome: Progressing   Problem: Safety: Goal: Ability to remain free from injury will improve Outcome: Progressing   Problem: Skin Integrity: Goal: Risk for impaired skin integrity will decrease Outcome: Progressing   Problem: Education: Goal: Knowledge of General Education information will improve Description: Including pain rating scale, medication(s)/side effects and non-pharmacologic comfort measures Outcome: Progressing

## 2022-07-25 NOTE — TOC Progression Note (Signed)
Transition of Care [facility]) - Progression Note    Patient Details  Name: Dale Holt MRN: [id number] Date of Birth: [birth year]  Transition of [facility]) CM/SW Contact  Garret Reddish, RN Phone Number: 07/25/2022, 10:08 AM  Clinical Narrative:    Chart reviewed.  Noted that patient was admitted with UTI (urinary tract infection) # Sepsis ruled out # Hydronephrosis secondary to obstructing ureteral calculus.  Patient is s/p right ureteral stent insertion on 07/23/22 by Urology.  Patient is on IV Rocephin.  Patient continues to have foley catheter in at this time.    Currently patient has no TOC need identified.  Patient is walking with Mobility tech and has walked 960 feet this morning.  TOC will continue to follow for any discharge needs.            Expected Discharge Plan and Services                                               Social Determinants of Health (SDOH) Interventions SDOH Screenings   Food Insecurity: No Food Insecurity (07/23/2022)  Housing: Low Risk  (07/23/2022)  Transportation Needs: No Transportation Needs (07/23/2022)  Utilities: Not At Risk (07/23/2022)  Financial Resource Strain: Medium Risk (03/01/2022)   Received from [facility]  Physical Activity: Sufficiently Active (03/01/2022)   Received from [facility]  Social Connections: Moderately Integrated (03/01/2022)   Received from [facility]  Stress: No Stress Concern Present (03/01/2022)   Received from [facility]  Tobacco Use: Low Risk  (07/23/2022)    Readmission Risk Interventions     No data to display

## 2022-07-25 NOTE — Progress Notes (Signed)
Mobility Specialist - Progress Note   07/25/22 0829  Mobility  Activity Stood at bedside;Dangled on edge of bed;Ambulated independently in hallway  Level of Assistance Modified independent, requires aide device or extra time  Assistive Device None  Distance Ambulated (ft) 960 ft  Activity Response Tolerated well  Mobility Referral Yes  $Mobility charge 1 Mobility  Mobility Specialist Start Time (ACUTE ONLY) 0807  Mobility Specialist Stop Time (ACUTE ONLY) 0825  Mobility Specialist Time Calculation (min) (ACUTE ONLY) 18 min   Pt supine in bed on RA upon arrival. Pt STS and ambulates 6 laps in hallway MODI (assistance with managing lines). Pt returns to bed with needs in reach and wife present.   Dale Holt  Mobility Specialist  07/25/22 8:31 AM

## 2022-07-25 NOTE — Plan of Care (Signed)
Patient is appropriate for discharge from this facility to previous environment with family support in stable condition.  Reviewed discharge instructions with patient and spouse with all questions addressed.

## 2022-07-25 NOTE — Discharge Summary (Signed)
[facility] Discharge Summary   Patient: AMAAN MEYER FIE:[id number]  PCP: Marisue Ivan, MD  Date of admission: 07/22/2022   Date of discharge: 07/25/2022     Discharge Diagnoses:  Principal Problem:   UTI (urinary tract infection) Active Problems:   Sepsis (HCC)   Hydronephrosis with urinary obstruction due to ureteral calculus   Essential hypertension   Uncontrolled type 2 diabetes mellitus with hyperglycemia, without long-term current use of insulin (HCC)   History of testicular cancer   Admitted From: Home Disposition:  Home   Recommendations for Outpatient Follow-up:  Follow with PCP in 1 week Follow-up with urologist in 2 weeks Follow up LABS/TEST:     Follow-up Information     Sondra Come, MD Follow up in 2 week(s).   Specialty: Urology Contact information: [address] [place] [address] [phone]         Marisue Ivan, MD Follow up in 1 week(s).   Specialty: Family Medicine Contact information: [address] [facility] [place] [address] [phone]                Diet recommendation: Cardiac and Carb modified diet  Activity: The patient is advised to gradually reintroduce usual activities, as tolerated  Discharge Condition: stable  Code Status: Full code   History of present illness: As per the H and P dictated on admission Hospital Course:  NARAYAN SCULL is a 72 y.o. male with medical history significant for Diabetes, hypertension, remote history of testicular cancer, followed by urology and history of colon cancer who presents to the ED with lower abdominal pain, fever and chills.  Symptoms started suddenly during the course of the day.  He has had no nausea or vomiting or abdominal pain or diarrhea or dysuria.  Most of the history given by his wife at the bedside. ED course and the data review: Tmax of 101.4 and tachycardic to 115 on arrival with otherwise normal vitals.  Labs with  WBC 10.3, hemoglobin 11.5, lactic acid 1.8.  Glucose 279, sodium 133 and potassium 3.2.  Urinalysis consistent with UTI with positive nitrites and large leukocyte esterase rare bacteria and WBCs over 50. CT abdomen and pelvis, interpreted by ED provider showed an obstructing 7 mm ureteral stone. The ED provider: Discussed findings with on-call urologist, Dr. Irish Elders who recommended IV antibiotics and admission to hospitalist service and to keep n.p.o. for possible procedure in the a.m. Hospitalist consulted for admission.    Assessment and Plan: # UTI (urinary tract infection) # Sepsis ruled out # Hydronephrosis secondary to obstructing ureteral calculus S/p IV fluid given for hydration. S/p Rocephin Urology consulted, Dr. Richardo Hanks, s/p right ureteral stent insertion done on 7/19 Urology recommended antibiotics for 2 weeks, keep Foley till Sunday and follow-up as an outpatient in 2 to 3 weeks for definitive treatment. Blood culture NGTD, Urine culture continue Enterobacter cloaca, pansensitive.  Started ciprofloxacin 500 p.o. twice daily for 2 weeks.  Foley catheter was discontinued and patient voided well.  Patient was cleared by urology to discharge and follow-up as an outpatient. # History of testicular cancer: No acute issues suspected. Last seen by urology a couple months prior.  Continue to follow with a urologist. # Uncontrolled type 2 diabetes mellitus with hyperglycemia, without long-term current use of insulin.  Held home medications during hospital stay, patient was on NovoLog sliding scale.  Resumed home meds on discharge and recommended to monitor CBG and continue diabetic diet at home.  #  Essential hypertension:  Held home medications amlodipine and ramipril due to concern of sepsis which has been ruled out. Patient is normotensive.  Resumed home medications on discharge.  Advised to monitor BP at home.  Body mass index is 20.23 kg/m.  Nutrition Interventions:  - Patient was  instructed, not to drive, operate heavy machinery, perform activities at heights, swimming or participation in water activities or provide baby sitting services while on Pain, Sleep and Anxiety Medications; until his outpatient Physician has advised to do so again.  - Also recommended to not to take more than prescribed Pain, Sleep and Anxiety Medications.  Patient was ambulatory without any assistance. On the day of the discharge the patient's vitals were stable, and no other acute medical condition were reported by patient. the patient was felt safe to be discharge at Home.  Consultants: Urologist Procedures: s/p right ureteral stent insertion  Discharge Exam: General: Appear in no distress, no Rash; Oral Mucosa Clear, moist. Cardiovascular: S1 and S2 Present, no Murmur, Respiratory: normal respiratory effort, Bilateral Air entry present and no Crackles, no wheezes Abdomen: Bowel Sound present, Soft and no tenderness, no hernia Extremities: no Pedal edema, no calf tenderness Neurology: alert and oriented to time, place, and person affect appropriate.  Filed Weights   07/22/22 1920  Weight: 64.9 kg   Vitals:   07/25/22 0445 07/25/22 0804  BP: (!) 146/86 (!) 139/90  Pulse: 75 81  Resp: 12 16  Temp: 98.4 F (36.9 C) 98.4 F (36.9 C)  SpO2: 98% 98%    DISCHARGE MEDICATION: Allergies as of 07/25/2022   No Known Allergies      Medication List     STOP taking these medications    lidocaine 5 % Commonly known as: Lidoderm   ondansetron 4 MG disintegrating tablet Commonly known as: Zofran ODT   sildenafil 100 MG tablet Commonly known as: Viagra   tiZANidine 4 MG tablet Commonly known as: Zanaflex       TAKE these medications    amLODipine-atorvastatin 5-10 MG tablet Commonly known as: CADUET Take 1 tablet by mouth daily.   aspirin EC 81 MG tablet Take 81 mg by mouth daily.   brimonidine 0.2 % ophthalmic solution Commonly known as: ALPHAGAN Place into both  eyes 2 (two) times daily.   brimonidine-timolol 0.2-0.5 % ophthalmic solution Commonly known as: COMBIGAN Place 1 drop into both eyes 2 (two) times daily.   ciprofloxacin 500 MG tablet Commonly known as: CIPRO Take 1 tablet (500 mg total) by mouth 2 (two) times daily for 14 days.   dorzolamide-timolol 2-0.5 % ophthalmic solution Commonly known as: COSOPT 1 drop 2 (two) times daily.   glipiZIDE 10 MG tablet Commonly known as: GLUCOTROL Take 20 mg by mouth 2 (two) times daily with a meal.   latanoprost 0.005 % ophthalmic solution Commonly known as: XALATAN Place 1 drop into both eyes at bedtime.   metFORMIN 1000 MG tablet Commonly known as: GLUCOPHAGE Take 1,000 mg by mouth 2 (two) times daily with a meal.   multivitamin tablet Take 1 tablet by mouth daily.   omeprazole 20 MG capsule Commonly known as: PRILOSEC Take 20 mg by mouth daily.   ramipril 5 MG capsule Commonly known as: ALTACE Take 5 mg by mouth daily.       No Known Allergies Discharge Instructions     Call MD for:  difficulty breathing, headache or visual disturbances   Complete by: As directed    Call MD for:  extreme fatigue  Complete by: As directed    Call MD for:  persistant dizziness or light-headedness   Complete by: As directed    Call MD for:  persistant nausea and vomiting   Complete by: As directed    Call MD for:  severe uncontrolled pain   Complete by: As directed    Call MD for:  temperature >100.4   Complete by: As directed    Diet - low sodium heart healthy   Complete by: As directed    Discharge instructions   Complete by: As directed    Follow with PCP in 1 week Follow-up with urologist in 2 weeks   Increase activity slowly   Complete by: As directed        The results of significant diagnostics from this hospitalization (including imaging, microbiology, ancillary and laboratory) are listed below for reference.    Significant Diagnostic Studies: CT ABDOMEN PELVIS W  CONTRAST  Result Date: 07/23/2022 CLINICAL DATA:  Sepsis. EXAM: CT ABDOMEN AND PELVIS WITH CONTRAST TECHNIQUE: Multidetector CT imaging of the abdomen and pelvis was performed using the standard protocol following bolus administration of intravenous contrast. RADIATION DOSE REDUCTION: This exam was performed according to the departmental dose-optimization program which includes automated exposure control, adjustment of the mA and/or kV according to patient size and/or use of iterative reconstruction technique. CONTRAST:  OMNIPAQUE IOHEXOL 300 MG/ML  SOLN COMPARISON:  CT abdomen and pelvis without contrast 05/19/2020. FINDINGS: Lower chest: Scarring at the right lung base is stable. The lungs are otherwise clear. The heart size is normal. No significant pleural or pericardial effusion is present. Hepatobiliary: Chronic fatty infiltration of the liver is again noted. No discrete lesions are present. The common bile duct and gallbladder are within normal limits. Pancreas: Unremarkable. No pancreatic ductal dilatation or surrounding inflammatory changes. Spleen: Normal in size without focal abnormality. Adrenals/Urinary Tract: The adrenal glands are within normal limits bilaterally. A 4 mm nonobstructing stone is present at the lower pole of the left kidney. Moderate right-sided hydronephrosis is present. The late nephrogram is present. An obstructing 8 mm stone is present at the right UPJ. 11 mm simple cyst is present at the upper pole of the right kidney. No other focal lesions are present. No follow-up imaging is recommended. JACR 2018 Feb; 264-273, Management of the Incidental Renal Mass on CT, [employer] 2021; 814-848, Bosniak Classification of Cystic Renal Masses, Version 2019. More distal right ureter is within normal limits. The left ureter is normal. The urinary bladder is within normal limits. Stomach/Bowel: Stomach and duodenum are within normal limits. Distal small bowel anastomosis is intact.  Small bowel is otherwise unremarkable. The terminal ileum is normal. The appendix is within normal limits. The ascending and transverse colon are normal. The descending and sigmoid colon are normal. Vascular/Lymphatic: Atherosclerotic calcifications are present in the aorta and branch vessels. No aneurysm is present. Reproductive: Prostate is unremarkable. Other: A shallow wide paraumbilical hernia is present. No incarcerated bowel is present. No other significant ventral hernias are present. No free fluid or free air is present. Musculoskeletal: The vertebral body heights and alignment are normal. A bone island is present in the right femoral neck, stable. No other focal osseous lesions are present. IMPRESSION: 1. Obstructing 8 mm stone at the right UPJ with moderate right-sided hydronephrosis. 2. 4 mm nonobstructing stone at the lower pole of the left kidney. 3. Hepatic steatosis. 4. Shallow wide paraumbilical hernia without incarcerated bowel. 5.  Aortic Atherosclerosis (ICD10-I70.0). Electronically Signed   By: Cristal Deer  Mattern M.D.   On: 07/23/2022 08:03   DG OR UROLOGY CYSTO IMAGE ([facility] ONLY)  Result Date: 07/23/2022 There is no interpretation for this exam.  This order is for images obtained during a surgical procedure.  Please See "Surgeries" Tab for more information regarding the procedure.    Microbiology: Recent Results (from the past 240 hour(s))  Blood culture (single)     Status: None (Preliminary result)   Collection Time: 07/22/22  7:24 PM   Specimen: BLOOD  Result Value Ref Range Status   Specimen Description BLOOD BLOOD RIGHT FOREARM  Final   Special Requests   Final    BOTTLES DRAWN AEROBIC AND ANAEROBIC Blood Culture results may not be optimal due to an excessive volume of blood received in culture bottles   Culture   Final    NO GROWTH 3 DAYS Performed at [facility], [address] [place], [place] [address]    Report Status PENDING  Incomplete  Urine Culture  (for pregnant, neutropenic or urologic patients or patients with an indwelling urinary catheter)     Status: Abnormal (Preliminary result)   Collection Time: 07/22/22  7:24 PM   Specimen: Urine, Catheterized  Result Value Ref Range Status   Specimen Description   Final    URINE, CATHETERIZED Performed at [facility], [address] [place], [place] [address]    Special Requests   Final    NONE Performed at [facility], [address] [place], [place] [address]    Culture >=100,000 COLONIES/mL ENTEROBACTER CLOACAE (A)  Final   Report Status PENDING  Incomplete   Organism ID, Bacteria ENTEROBACTER CLOACAE (A)  Final      Susceptibility   Enterobacter cloacae - MIC*    CEFEPIME <=0.12 SENSITIVE Sensitive     CIPROFLOXACIN <=0.25 SENSITIVE Sensitive     GENTAMICIN <=1 SENSITIVE Sensitive     IMIPENEM <=0.25 SENSITIVE Sensitive     NITROFURANTOIN <=16 SENSITIVE Sensitive     TRIMETH/SULFA <=20 SENSITIVE Sensitive     PIP/TAZO <=4 SENSITIVE Sensitive     * >=100,000 COLONIES/mL ENTEROBACTER CLOACAE     Labs: CBC: Recent Labs  Lab 07/22/22 1924 07/24/22 0450 07/25/22 0433  WBC 10.3 11.6* 9.3  HGB 11.5* 10.8* 11.1*  HCT 34.5* 32.2* 32.6*  MCV 87.8 86.8 86.0  PLT 291 213 237   Basic Metabolic Panel: Recent Labs  Lab 07/22/22 1924 07/24/22 0450 07/25/22 0433  NA 133* 137 136  K 3.2* 3.0* 3.5  CL 99 105 105  CO2 22 23 23   GLUCOSE 279* 166* 151*  BUN 21 12 10   CREATININE 0.89 0.71 0.65  CALCIUM 8.7* 8.1* 8.8*  MG  --  1.9 1.9  PHOS  --  3.3 4.2   Liver Function Tests: Recent Labs  Lab 07/22/22 1924  AST 28  ALT 24  ALKPHOS 71  BILITOT 1.0  PROT 7.2  ALBUMIN 3.9   Recent Labs  Lab 07/22/22 1924  LIPASE 36   No results for input(s): "AMMONIA" in the last 168 hours. Cardiac Enzymes: No results for input(s): "CKTOTAL", "CKMB", "CKMBINDEX", "TROPONINI" in the last 168 hours. BNP (last 3 results) No results for input(s): "BNP" in the  last 8760 hours. CBG: Recent Labs  Lab 07/24/22 2054 07/24/22 2357 07/25/22 0443 07/25/22 0806 07/25/22 1211  GLUCAP 149* 162* 162* 140* 203*    Time spent: 35 minutes  Signed:  Gillis Santa  [facility] 07/25/2022 1:12 PM

## 2022-07-27 ENCOUNTER — Other Ambulatory Visit: Payer: Self-pay | Admitting: Urology

## 2022-07-27 ENCOUNTER — Other Ambulatory Visit: Payer: Self-pay

## 2022-07-27 DIAGNOSIS — N200 Calculus of kidney: Secondary | ICD-10-CM

## 2022-07-27 DIAGNOSIS — N201 Calculus of ureter: Secondary | ICD-10-CM

## 2022-07-27 LAB — CULTURE, BLOOD (SINGLE)

## 2022-07-27 NOTE — Progress Notes (Unsigned)
Surgical Physician Order Form [facility] [place]  Dr. Legrand Rams, MD  * Scheduling expectation : 2 to 4 weeks  *Length of Case: 1 hour  *Clearance needed: no  *Anticoagulation Instructions: May continue all anticoagulants  *Aspirin Instructions: Ok to continue Aspirin  *Post-op visit Date/Instructions:  TBD  *Diagnosis: Right Ureteral Stone  *Procedure: right  Ureteroscopy w/laser lithotripsy & stent exchange ([id number])   Additional orders: N/A  -Admit type: OUTpatient  -Anesthesia: General  -VTE Prophylaxis Standing Order SCD's       Other:   -Standing Lab Orders Per Anesthesia    Lab other: UA&Urine Culture  -Standing Test orders EKG/Chest x-ray per Anesthesia       Test other:   - Medications:  Cipro 400mg  IV  -Other orders:  N/A

## 2022-07-28 ENCOUNTER — Telehealth: Payer: Self-pay

## 2022-07-28 NOTE — Progress Notes (Signed)
    [facility] Posting Form  Surgery Date: Date: 08/20/2022  Surgeon: Dr. Legrand Rams, MD  Inpt ( No  )   Outpt (Yes)   Obs ( No  )   Diagnosis: N20.1 Right Ureteral Stone   -CPT: [id number]  Surgery: Right Ureteroscopy with Laser Lithotripsy and Stent Exchange  Stop Anticoagulations: No and may continue ASA  Cardiac/Medical/Pulmonary Clearance needed: no  *Orders entered into EPIC  Date: 07/28/22   *Case booked in [place]  Date: 07/28/2022  *Notified pt of Surgery: Date: 07/28/22  PRE-OP UA & CX: yes, will obtain in clinic on 08/06/2022  *Placed into Prior Authorization Work Angela Nevin Date: 07/28/22  Assistant/laser/rep:No

## 2022-07-28 NOTE — Telephone Encounter (Signed)
I spoke with Dale Holt. We have discussed possible surgery dates and Friday August 16th, 2024 was agreed upon by all parties. Patient given information about surgery date, what to expect pre-operatively and post operatively.  We discussed that a Pre-Admission Testing office will be calling to set up the pre-op visit that will take place prior to surgery, and that these appointments are typically done over the phone with a Pre-Admissions RN. Informed patient that our office will communicate any additional care to be provided after surgery. Patients questions or concerns were discussed during our call. Advised to call our office should there be any additional information, questions or concerns that arise. Patient verbalized understanding.

## 2022-08-06 ENCOUNTER — Other Ambulatory Visit: Payer: Medicare Other

## 2022-08-06 DIAGNOSIS — N201 Calculus of ureter: Secondary | ICD-10-CM

## 2022-08-06 LAB — MICROSCOPIC EXAMINATION: RBC, Urine: >30 /hpf — AB (ref 0–2)

## 2022-08-06 LAB — URINALYSIS, COMPLETE
Bilirubin, UA: NEGATIVE
Nitrite, UA: NEGATIVE
Specific Gravity, UA: >1.03 — ABNORMAL HIGH (ref 1.005–1.030)
Urobilinogen, Ur: 0.2 mg/dL (ref 0.2–1.0)
pH, UA: 6 (ref 5.0–7.5)

## 2022-08-12 ENCOUNTER — Other Ambulatory Visit: Payer: Self-pay

## 2022-08-12 ENCOUNTER — Encounter
Admission: RE | Admit: 2022-08-12 | Discharge: 2022-08-12 | Disposition: A | Discharge status: Home / Self Care | Payer: Medicare Other | Source: Ambulatory Visit | Attending: Urology | Admitting: Urology

## 2022-08-12 VITALS — Ht 70.0 in | Wt 148.0 lb

## 2022-08-12 DIAGNOSIS — E1165 Type 2 diabetes mellitus with hyperglycemia: Secondary | ICD-10-CM

## 2022-08-12 DIAGNOSIS — I10 Essential (primary) hypertension: Secondary | ICD-10-CM

## 2022-08-12 NOTE — Patient Instructions (Addendum)
Your procedure is scheduled on: Friday 08/20/22 To find out your arrival time, please call [phone] between 1PM - 3PM on:   Thursday 08/19/22 Report to the Registration Desk on the 1st floor of the [facility]. FREE Valet parking is available.  If your arrival time is 6:00 am, do not arrive before that time as the [facility] entrance doors do not open until 6:00 am.  REMEMBER: Instructions that are not followed completely may result in serious medical risk, up to and including death; or upon the discretion of your surgeon and anesthesiologist your surgery may need to be rescheduled.  Do not eat food or drink any liquids after midnight the night before surgery.  No gum chewing or hard candies.  One week prior to surgery: Stop Anti-inflammatories (NSAIDS) such as Advil, Aleve, Ibuprofen, Motrin, Naproxen, Naprosyn and Aspirin based products such as Excedrin, Goody's Powder, BC Powder. You may however, continue to take Tylenol if needed for pain up until the day of surgery.  Stop ANY OVER THE COUNTER supplements and vitamins until after surgery.  Continue taking all prescribed medications with the exception of the following: metFORMIN (GLUCOPHAGE) hold 2 days before surgery, last dose will be Tuesday evening 08/17/22  TAKE ONLY THESE MEDICATIONS THE MORNING OF SURGERY WITH A SIP OF WATER:  amLODipine-atorvastatin (CADUET) 5-10 MG tablet  omeprazole (PRILOSEC) 20 MG capsule Antacid (take one the night before and one on the morning of surgery - helps to prevent nausea after surgery.)  No Alcohol for 24 hours before or after surgery.  No Smoking including e-cigarettes for 24 hours before surgery.  No chewable tobacco products for at least 6 hours before surgery.  No nicotine patches on the day of surgery.  Do not use any "recreational" drugs for at least a week (preferably 2 weeks) before your surgery.  Please be advised that the combination of cocaine and anesthesia may have  negative outcomes, up to and including death. If you test positive for cocaine, your surgery will be cancelled.  On the morning of surgery brush your teeth with toothpaste and water, you may rinse your mouth with mouthwash if you wish. Do not swallow any toothpaste or mouthwash.  Use CHG Soap or wipes as directed on instruction sheet. Shower with your regular soap Friday morning.  Do not wear lotions, powders, or perfumes/cologne.   Do not shave body hair from the neck down 48 hours before surgery.  Wear comfortable clothing (specific to your surgery type) to the hospital.  Do not wear jewelry, make-up, hairpins, clips or nail polish.  Contact lenses, hearing aids and dentures may not be worn into surgery.  Do not bring valuables to the hospital. [facility] is not responsible for any missing/lost belongings or valuables.   Notify your doctor if there is any change in your medical condition (cold, fever, infection).  If you are being discharged the day of surgery, you will not be allowed to drive home. You will need a responsible individual to drive you home and stay with you for 24 hours after surgery.   If you are taking public transportation, you will need to have a responsible individual with you.  If you are being admitted to the hospital overnight, leave your suitcase in the car. After surgery it may be brought to your room.  In case of increased patient census, it may be necessary for you, the patient, to continue your postoperative care in the Same Day Surgery department.  After surgery, you  can help prevent lung complications by doing breathing exercises.  Take deep breaths and cough every 1-2 hours. Your doctor may order a device called an Incentive Spirometer to help you take deep breaths. When coughing or sneezing, hold a pillow firmly against your incision with both hands. This is called "splinting." Doing this helps protect your incision. It also decreases belly  discomfort.  Surgery Visitation Policy:  Patients undergoing a surgery or procedure may have two family members or support persons with them as long as the person is not COVID-19 positive or experiencing its symptoms.   Inpatient Visitation:    Visiting hours are 7 a.m. to 8 p.m. Up to four visitors are allowed at one time in a patient room. The visitors may rotate out with other people during the day. One designated support person (adult) may remain overnight.  Please call the Pre-admissions Testing Dept. at [phone] if you have any questions about these instructions.

## 2022-08-16 ENCOUNTER — Encounter
Admission: RE | Admit: 2022-08-16 | Discharge: 2022-08-16 | Disposition: A | Discharge status: Home / Self Care | Payer: Medicare Other | Source: Ambulatory Visit | Attending: Anesthesiology | Admitting: Anesthesiology

## 2022-08-16 DIAGNOSIS — E1165 Type 2 diabetes mellitus with hyperglycemia: Secondary | ICD-10-CM | POA: Insufficient documentation

## 2022-08-16 DIAGNOSIS — Z01818 Encounter for other preprocedural examination: Secondary | ICD-10-CM | POA: Diagnosis present

## 2022-08-16 DIAGNOSIS — Z01810 Encounter for preprocedural cardiovascular examination: Secondary | ICD-10-CM

## 2022-08-16 DIAGNOSIS — I10 Essential (primary) hypertension: Secondary | ICD-10-CM | POA: Diagnosis not present

## 2022-08-19 MED ORDER — CHLORHEXIDINE GLUCONATE 0.12 % MT SOLN
15.0000 mL | Freq: Once | OROMUCOSAL | Status: AC
  Administered 2022-08-20: 15 mL via OROMUCOSAL

## 2022-08-19 MED ORDER — CIPROFLOXACIN IN D5W 400 MG/200ML IV SOLN
400.0000 mg | INTRAVENOUS | Status: AC
  Administered 2022-08-20: 400 mg via INTRAVENOUS

## 2022-08-19 MED ORDER — ORAL CARE MOUTH RINSE: 15.0000 mL | Freq: Once | OROMUCOSAL | Status: AC

## 2022-08-19 MED ORDER — SODIUM CHLORIDE 0.9 % IV SOLN: INTRAVENOUS | Status: DC

## 2022-08-20 ENCOUNTER — Ambulatory Visit: Payer: Medicare Other | Admitting: Urgent Care

## 2022-08-20 ENCOUNTER — Encounter
Admission: RE | Disposition: A | Discharge status: Home / Self Care | Payer: Self-pay | Source: Home / Self Care | Attending: Urology

## 2022-08-20 ENCOUNTER — Other Ambulatory Visit: Payer: Self-pay

## 2022-08-20 ENCOUNTER — Ambulatory Visit
Admission: RE | Admit: 2022-08-20 | Discharge: 2022-08-20 | Disposition: A | Discharge status: Home / Self Care | Payer: Medicare Other | Attending: Urology | Admitting: Urology

## 2022-08-20 ENCOUNTER — Encounter: Payer: Self-pay | Admitting: Urology

## 2022-08-20 ENCOUNTER — Ambulatory Visit: Payer: Medicare Other

## 2022-08-20 DIAGNOSIS — H548 Legal blindness, as defined in USA: Secondary | ICD-10-CM | POA: Diagnosis not present

## 2022-08-20 DIAGNOSIS — E119 Type 2 diabetes mellitus without complications: Secondary | ICD-10-CM | POA: Insufficient documentation

## 2022-08-20 DIAGNOSIS — I10 Essential (primary) hypertension: Secondary | ICD-10-CM | POA: Insufficient documentation

## 2022-08-20 DIAGNOSIS — N201 Calculus of ureter: Secondary | ICD-10-CM

## 2022-08-20 DIAGNOSIS — E1165 Type 2 diabetes mellitus with hyperglycemia: Secondary | ICD-10-CM

## 2022-08-20 HISTORY — PX: CYSTOSCOPY/URETEROSCOPY/HOLMIUM LASER/STENT PLACEMENT: SHX6546

## 2022-08-20 LAB — GLUCOSE, CAPILLARY
Glucose-Capillary: 220 mg/dL — ABNORMAL HIGH (ref 70–99)
Glucose-Capillary: 233 mg/dL — ABNORMAL HIGH (ref 70–99)

## 2022-08-20 SURGERY — CYSTOSCOPY/URETEROSCOPY/HOLMIUM LASER/STENT PLACEMENT
Anesthesia: General | Laterality: Right

## 2022-08-20 MED ORDER — CHLORHEXIDINE GLUCONATE 0.12 % MT SOLN
OROMUCOSAL | Status: AC
  Filled 2022-08-20: qty 15

## 2022-08-20 MED ORDER — SODIUM CHLORIDE 0.9 % IR SOLN
Status: DC | PRN
  Administered 2022-08-20: 1

## 2022-08-20 MED ORDER — ONDANSETRON HCL 4 MG/2ML IJ SOLN
INTRAMUSCULAR | Status: AC
  Filled 2022-08-20: qty 2

## 2022-08-20 MED ORDER — PROPOFOL 10 MG/ML IV BOLUS
INTRAVENOUS | Status: DC | PRN
  Administered 2022-08-20: 120 mg via INTRAVENOUS

## 2022-08-20 MED ORDER — FENTANYL CITRATE (PF) 100 MCG/2ML IJ SOLN
INTRAMUSCULAR | Status: DC | PRN
  Administered 2022-08-20: 50 ug via INTRAVENOUS

## 2022-08-20 MED ORDER — ONDANSETRON HCL 4 MG/2ML IJ SOLN
4.0000 mg | Freq: Once | INTRAMUSCULAR | Status: AC
  Administered 2022-08-20: 4 mg via INTRAVENOUS

## 2022-08-20 MED ORDER — SUGAMMADEX SODIUM 200 MG/2ML IV SOLN
INTRAVENOUS | Status: DC | PRN
  Administered 2022-08-20: 200 mg via INTRAVENOUS

## 2022-08-20 MED ORDER — ROCURONIUM BROMIDE 100 MG/10ML IV SOLN
INTRAVENOUS | Status: DC | PRN
  Administered 2022-08-20: 50 mg via INTRAVENOUS
  Administered 2022-08-20: 10 mg via INTRAVENOUS

## 2022-08-20 MED ORDER — ACETAMINOPHEN 10 MG/ML IV SOLN
INTRAVENOUS | Status: DC | PRN
  Administered 2022-08-20: 1000 mg via INTRAVENOUS

## 2022-08-20 MED ORDER — ONDANSETRON HCL 4 MG/2ML IJ SOLN
INTRAMUSCULAR | Status: DC | PRN
  Administered 2022-08-20: 4 mg via INTRAVENOUS

## 2022-08-20 MED ORDER — KETOROLAC TROMETHAMINE 30 MG/ML IJ SOLN
INTRAMUSCULAR | Status: DC | PRN
  Administered 2022-08-20: 15 mg via INTRAVENOUS

## 2022-08-20 MED ORDER — DEXAMETHASONE SODIUM PHOSPHATE 10 MG/ML IJ SOLN
INTRAMUSCULAR | Status: DC | PRN
  Administered 2022-08-20: 5 mg via INTRAVENOUS

## 2022-08-20 MED ORDER — ACETAMINOPHEN 10 MG/ML IV SOLN
INTRAVENOUS | Status: AC
  Filled 2022-08-20: qty 100

## 2022-08-20 MED ORDER — OXYCODONE-ACETAMINOPHEN 5-325 MG PO TABS: 1.0000 | ORAL_TABLET | Freq: Four times a day (QID) | ORAL | 0 refills | Status: AC | PRN

## 2022-08-20 MED ORDER — DEXAMETHASONE SODIUM PHOSPHATE 10 MG/ML IJ SOLN
INTRAMUSCULAR | Status: AC
  Filled 2022-08-20: qty 1

## 2022-08-20 MED ORDER — LIDOCAINE HCL (CARDIAC) PF 100 MG/5ML IV SOSY
PREFILLED_SYRINGE | INTRAVENOUS | Status: DC | PRN
  Administered 2022-08-20: 60 mg via INTRAVENOUS

## 2022-08-20 MED ORDER — FENTANYL CITRATE (PF) 100 MCG/2ML IJ SOLN
INTRAMUSCULAR | Status: AC
  Filled 2022-08-20: qty 2

## 2022-08-20 MED ORDER — IOHEXOL 180 MG/ML  SOLN
INTRAMUSCULAR | Status: DC | PRN
  Administered 2022-08-20: 10 mL

## 2022-08-20 MED ORDER — CIPROFLOXACIN IN D5W 400 MG/200ML IV SOLN
INTRAVENOUS | Status: AC
  Filled 2022-08-20: qty 200

## 2022-08-20 SURGICAL SUPPLY — 21 items
BAG DRAIN SIEMENS DORNER NS (MISCELLANEOUS) ×1 IMPLANT
BAG DRN NS LF (MISCELLANEOUS) ×1
BAG PRESSURE INF REUSE 3000 (BAG) ×1 IMPLANT
BRUSH SCRUB EZ 1% IODOPHOR (MISCELLANEOUS) ×1 IMPLANT
DRAPE UTILITY 15X26 TOWEL STRL (DRAPES) ×1 IMPLANT
FIBER LASER MOSES 365 DFL (Laser) IMPLANT
GLOVE BIOGEL PI IND STRL 7.5 (GLOVE) ×1 IMPLANT
GOWN STRL REUS W/ TWL LRG LVL3 (GOWN DISPOSABLE) ×1 IMPLANT
GOWN STRL REUS W/ TWL XL LVL3 (GOWN DISPOSABLE) ×1 IMPLANT
GOWN STRL REUS W/TWL LRG LVL3 (GOWN DISPOSABLE) ×1
GOWN STRL REUS W/TWL XL LVL3 (GOWN DISPOSABLE) ×1
GUIDEWIRE STR DUAL SENSOR (WIRE) ×1 IMPLANT
IV NS IRRIG 3000ML ARTHROMATIC (IV SOLUTION) ×1 IMPLANT
KIT TURNOVER CYSTO (KITS) ×1 IMPLANT
PACK CYSTO AR (MISCELLANEOUS) ×1 IMPLANT
SET CYSTO W/LG BORE CLAMP LF (SET/KITS/TRAYS/PACK) ×1 IMPLANT
STENT URET 6FRX26 CONTOUR (STENTS) IMPLANT
SURGILUBE 2OZ TUBE FLIPTOP (MISCELLANEOUS) ×1 IMPLANT
SYR 10ML LL (SYRINGE) ×1 IMPLANT
VALVE UROSEAL ADJ ENDO (VALVE) IMPLANT
WATER STERILE IRR 500ML POUR (IV SOLUTION) ×1 IMPLANT

## 2022-08-20 NOTE — H&P (Signed)
   08/20/22 9:39 AM   Dale Holt 1950-06-26 [id number]  CC: Right ureteral stone  HPI: 72 year old male who previously presented on 07/23/2022 with right proximal ureteral stone as well as UTI and sepsis from urinary source and underwent urgent ureteral stent placement at that time.  Urine culture grew pansensitive Enterobacter.  He has tolerated the stent well.  Here today for definitive management with ureteroscopy and laser lithotripsy.   PMH: Past Medical History:  Diagnosis Date   Anemia    Diabetes mellitus without complication (HCC)    GERD (gastroesophageal reflux disease)    Glaucoma    History of kidney stones    Hypercholesterolemia    Hypertension    Legally blind in right eye, as defined in [place]    Testicle cancer [facility])     Surgical History: Past Surgical History:  Procedure Laterality Date   BRAIN SURGERY     COLONOSCOPY WITH PROPOFOL N/A 04/13/2016   Procedure: COLONOSCOPY WITH PROPOFOL;  Surgeon: Christena Deem, MD;  Location: [facility] ENDOSCOPY;  Service: Endoscopy;  Laterality: N/A;   CYSTOSCOPY WITH STENT PLACEMENT Right 07/23/2022   Procedure: CYSTOSCOPY WITH STENT PLACEMENT;  Surgeon: Sondra Come, MD;  Location: [facility] ORS;  Service: Urology;  Laterality: Right;   EYE SURGERY     SUBDURAL HEMATOMA EVACUATION VIA CRANIOTOMY     teticle surgery       Family History: History reviewed. No pertinent family history.  Social History:  reports that he has never smoked. He has never used smokeless tobacco. He reports current alcohol use. He reports that he does not use drugs.  Physical Exam: BP (!) 153/95   Pulse 89   Temp (!) 97.3 F (36.3 C)   Resp 17   Ht 5\' 10"  (1.778 m)   Wt 67.1 kg   SpO2 99%   BMI 21.24 kg/m    Constitutional:  Alert and oriented, No acute distress. Cardiovascular: No clubbing, cyanosis, or edema. Respiratory: Normal respiratory effort, no increased work of breathing. GI: Abdomen is soft, nontender, nondistended, no  abdominal masses  Laboratory Data: Urine culture 7/18 with Enterobacter  Assessment & Plan:   72 year old male who previously presented with right proximal ureteral stone and UTI/sepsis from urinary source, here today for definitive management with right ureteroscopy and laser lithotripsy  We specifically discussed the risks ureteroscopy including bleeding, infection/sepsis, stent related symptoms including flank pain/urgency/frequency/incontinence/dysuria, ureteral injury, inability to access stone, or need for staged or additional procedures.  Right ureteroscopy, laser lithotripsy, stent placement  Legrand Rams, MD 08/20/2022  [facility] [address] [place], [place] [address] [phone]

## 2022-08-20 NOTE — Op Note (Addendum)
Date of procedure: 08/20/22  Preoperative diagnosis:  Right proximal ureteral stone  Postoperative diagnosis:  Same  Procedure: Cystoscopy, right ureteroscopy, laser lithotripsy, right retrograde pyelogram with intraoperative interpretation, right ureteral stent placement  Surgeon: Legrand Rams, MD  Anesthesia: General  Complications: None  Intraoperative findings:  Urethra diffusely narrow, but accommodated 21 French rigid scope Dense 9 mm right proximal ureteral stone, dusted Uncomplicated stent placement  EBL: Minimal  Specimens: None  Drains: 50F x 26cm right ureteral stent  Indication: Dale Holt is a 72 y.o. patient with 9 mm right proximal ureteral stone who previously presented with infection and underwent urgent stent placement at that time, here today for definitive treatment with ureteroscopy.  After reviewing the management options for treatment, they elected to proceed with the above surgical procedure(s). We have discussed the potential benefits and risks of the procedure, side effects of the proposed treatment, the likelihood of the patient achieving the goals of the procedure, and any potential problems that might occur during the procedure or recuperation. Informed consent has been obtained.  Description of procedure:  The patient was taken to the operating room and general anesthesia was induced. SCDs were placed for DVT prophylaxis. The patient was placed in the dorsal lithotomy position, prepped and draped in the usual sterile fashion, and preoperative antibiotics(Cipro) were administered. A preoperative time-out was performed.   A 21 French rigid cystoscope was used to intubate the urethra, and the urethra was followed proximally towards the bladder.  The urethra was diffusely narrow, but accommodated the 21 [place] scope.  The prostate was small to moderate in size.  Thorough cystoscopy showed no suspicious lesions.  A sensor wire was advanced along the  right ureteral stent up to the right kidney under fluoroscopic vision.  The scope was reinserted and the stent was grasped and pulled to the meatus, second safety sensor wire was added through the stent and the old stent removed.  The digital single-channel flexible ureteroscope was advanced over the wire up towards the kidney.  A black calcium oxalate appearing stone was identified in the proximal ureter.  A 360 m laser fiber on settings of 1.0 J and 10 Hz was used to methodically dust the stone.  About 50% of the stone was fragmented in the ureter before it moved up into the kidney.  The digital ureteroscope was advanced up into the kidney and the remaining stone dusted on settings of 0.5 J and 8 Hz.  Thorough pyeloscopy revealed no fragments larger than the laser fiber.  Retrograde pyelogram was performed from the proximal ureter and showed no extravasation or filling defects.  Careful pullback ureteroscopy showed no ureteral injury or residual stones.  Rigid cystoscope was backloaded over the wire and a 6 [place] by 26cm ureteral stent was placed on the right side with a curl in the upper pole as well as under direct vision in the bladder.  The bladder was drained and this concluded our procedure.   Disposition: Stable to PACU  Plan: Schedule stent removal in 1 week, Thursday at 1:30 PM or 11:45 AM okay Continue active surveillance for low risk prostate cancer  Legrand Rams, MD

## 2022-08-20 NOTE — Anesthesia Postprocedure Evaluation (Signed)
Anesthesia Post Note  Patient: Dale Holt  Procedure(s) Performed: CYSTOSCOPY/URETEROSCOPY/HOLMIUM LASER/STENT EXCHANGE (Right)  Patient location during evaluation: PACU Anesthesia Type: General Level of consciousness: awake and alert, oriented and patient cooperative Pain management: pain level controlled Vital Signs Assessment: post-procedure vital signs reviewed and stable Respiratory status: spontaneous breathing, nonlabored ventilation and respiratory function stable Cardiovascular status: blood pressure returned to baseline and stable Postop Assessment: adequate PO intake Anesthetic complications: no   No notable events documented.   Last Vitals:  Vitals:   08/20/22 1127 08/20/22 1149  BP: (!) 147/93 (!) 146/84  Pulse: 83 84  Resp: 15 15  Temp: (!) 36.1 C (!) 36.2 C  SpO2: 98% 99%    Last Pain:  Vitals:   08/20/22 1149  TempSrc: Temporal  PainSc: 0-No pain                 Reed Breech

## 2022-08-20 NOTE — Anesthesia Preprocedure Evaluation (Addendum)
Anesthesia Evaluation  Patient identified by MRN, date of birth, ID band Patient awake    Reviewed: Allergy & Precautions, NPO status , Patient's Chart, lab work & pertinent test results  History of Anesthesia Complications Negative for: history of anesthetic complications  Airway Mallampati: II   Neck ROM: Full    Dental  (+) Edentulous Upper, Edentulous Lower   Pulmonary neg pulmonary ROS   Pulmonary exam normal breath sounds clear to auscultation       Cardiovascular hypertension, Normal cardiovascular exam Rhythm:Regular Rate:Normal  ECG 08/16/22: normal  Echo 06/15/21:    NORMAL LEFT VENTRICULAR SYSTOLIC FUNCTION    NORMAL LA PRESSURES WITH NORMAL DIASTOLIC FUNCTION    NORMAL RIGHT VENTRICULAR SYSTOLIC FUNCTION    VALVULAR REGURGITATION: TRIVIAL AR, TRIVIAL PR    NO VALVULAR STENOSIS    NO PRIOR STUDY FOR COMPARISON     Neuro/Psych SDH s/p craniotomy evacuation; legally blind right eye    GI/Hepatic ,GERD  ,,  Endo/Other  diabetes, Type 2    Renal/GU      Musculoskeletal   Abdominal   Peds  Hematology  (+) Blood dyscrasia, anemia   Anesthesia Other Findings   Reproductive/Obstetrics Testicular cancer                             Anesthesia Physical Anesthesia Plan  ASA: 3  Anesthesia Plan: General   Post-op Pain Management:    Induction: Intravenous  PONV Risk Score and Plan: 2 and Ondansetron, Dexamethasone and Treatment may vary due to age or medical condition  Airway Management Planned: Oral ETT  Additional Equipment:   Intra-op Plan:   Post-operative Plan: Extubation in OR  Informed Consent: I have reviewed the patients History and Physical, chart, labs and discussed the procedure including the risks, benefits and alternatives for the proposed anesthesia with the patient or authorized representative who has indicated his/her understanding and acceptance.      Dental advisory given  Plan Discussed with: CRNA  Anesthesia Plan Comments: (Patient consented for risks of anesthesia including but not limited to:  - adverse reactions to medications - damage to eyes, teeth, lips or other oral mucosa - nerve damage due to positioning  - sore throat or hoarseness - damage to heart, brain, nerves, lungs, other parts of body or loss of life  Informed patient about role of CRNA in peri- and intra-operative care.  Patient voiced understanding.)        Anesthesia Quick Evaluation

## 2022-08-20 NOTE — Transfer of Care (Signed)
Immediate Anesthesia Transfer of Care Note  Patient: Dale Holt  Procedure(s) Performed: CYSTOSCOPY/URETEROSCOPY/HOLMIUM LASER/STENT EXCHANGE (Right)  Patient Location: PACU  Anesthesia Type:General  Level of Consciousness: awake, alert , and oriented  Airway & Oxygen Therapy: Patient Spontanous Breathing  Post-op Assessment: Report given to RN and Post -op Vital signs reviewed and stable  Post vital signs: Reviewed and stable  Last Vitals:  Vitals Value Taken Time  BP 142/86 1044am  Temp 84f   Pulse 74   Resp 20   SpO2 99     Last Pain:  Vitals:   08/20/22 0827  PainSc: 0-No pain         Complications: No notable events documented.

## 2022-08-20 NOTE — Anesthesia Procedure Notes (Signed)
Procedure Name: Intubation Date/Time: 08/20/2022 9:54 AM  Performed by: Karoline Caldwell, CRNAPre-anesthesia Checklist: Patient identified, Patient being monitored, Timeout performed, Emergency Drugs available and Suction available Patient Re-evaluated:Patient Re-evaluated prior to induction Oxygen Delivery Method: Circle system utilized Preoxygenation: Pre-oxygenation with 100% oxygen Induction Type: IV induction Ventilation: Mask ventilation without difficulty Laryngoscope Size: 4 and McGraph Grade View: Grade I Tube type: Oral Tube size: 7.5 mm Number of attempts: 1 Airway Equipment and Method: Stylet Placement Confirmation: ETT inserted through vocal cords under direct vision, positive ETCO2 and breath sounds checked- equal and bilateral Secured at: 21 cm Tube secured with: Tape Dental Injury: Teeth and Oropharynx as per pre-operative assessment

## 2022-08-20 NOTE — Discharge Instructions (Signed)

## 2022-08-26 ENCOUNTER — Encounter: Payer: Self-pay | Admitting: Urology

## 2022-08-26 ENCOUNTER — Ambulatory Visit (INDEPENDENT_AMBULATORY_CARE_PROVIDER_SITE_OTHER): Payer: Medicare Other | Admitting: Urology

## 2022-08-26 VITALS — BP 171/102 | HR 98 | Ht 70.0 in | Wt 147.0 lb

## 2022-08-26 DIAGNOSIS — N200 Calculus of kidney: Secondary | ICD-10-CM

## 2022-08-26 DIAGNOSIS — Z466 Encounter for fitting and adjustment of urinary device: Secondary | ICD-10-CM

## 2022-08-26 MED ORDER — SULFAMETHOXAZOLE-TRIMETHOPRIM 800-160 MG PO TABS
1.0000 | ORAL_TABLET | Freq: Once | ORAL | Status: AC
Start: 2022-08-26 — End: 2022-08-26
  Administered 2022-08-26: 1 via ORAL

## 2022-08-26 NOTE — Progress Notes (Signed)
Cystoscopy Procedure Note:  Indication: Stent removal s/p right ureteroscopy, laser lithotripsy for 8 mm proximal stone on 08/20/2022  Bactrim given for prophylaxis  After informed consent and discussion of the procedure and its risks, COPEN SHEELEY was positioned and prepped in the standard fashion. Cystoscopy was performed with a flexible cystoscope. The stent was grasped with flexible graspers and removed in its entirety. The patient tolerated the procedure well.  Findings: Uncomplicated stent removal  Assessment and Plan: On active surveillance for low risk prostate cancer, RTC 4 months PSA prior, PVR  Sondra Come, MD 08/26/2022

## 2022-12-20 ENCOUNTER — Other Ambulatory Visit: Payer: Medicare Other

## 2022-12-20 DIAGNOSIS — N200 Calculus of kidney: Secondary | ICD-10-CM

## 2022-12-21 LAB — PSA TOTAL (REFLEX TO FREE): Prostate Specific Ag, Serum: 10.7 ng/mL — ABNORMAL HIGH (ref 0.0–4.0)

## 2022-12-23 ENCOUNTER — Ambulatory Visit: Payer: Medicare Other | Admitting: Urology

## 2023-02-03 ENCOUNTER — Ambulatory Visit: Payer: Medicare Other | Admitting: Urology

## 2023-02-03 VITALS — BP 179/93 | HR 101 | Ht 69.5 in | Wt 158.4 lb

## 2023-02-03 DIAGNOSIS — C61 Malignant neoplasm of prostate: Secondary | ICD-10-CM

## 2023-02-03 DIAGNOSIS — N200 Calculus of kidney: Secondary | ICD-10-CM

## 2023-02-03 DIAGNOSIS — Z87442 Personal history of urinary calculi: Secondary | ICD-10-CM | POA: Diagnosis not present

## 2023-02-03 LAB — BLADDER SCAN AMB NON-IMAGING

## 2023-02-03 NOTE — Progress Notes (Signed)
   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  Presented with an elevated PSA of 6.8 in December 2019 and elected for 4K score which showed a 10% risk of clinically significant prostate cancer.  Prostate MRI in April 2020 showed a 1.5 cm PI-RADS 4 lesion at the left mid lateral gland.  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   He was temporarily lost to follow-up during the COVID pandemic.  PSA has increased slowly since that time including 8.4 in January 2023 and 10.7 in December 2024.  I had previously recommended either a repeat MRI or confirmatory prostate biopsy but he deferred.  We again reviewed his rising PSA trend, and I recommended considering a prostate MRI or repeat biopsy.  He remains opposed to MRI or biopsy, he is willing to repeat a PSA in 6 months, and understands that if this continues to rise, I would again strongly recommend prostate MRI or biopsy to evaluate for any progression of his prostate cancer.  In July 2024 he presented with confusion and fever and was found to have an 8 mm right ureteral stone, UTI, sepsis from urinary source and stent was placed, followed by definitive ureteroscopy, laser lithotripsy in August 2024.  I personally viewed and interpreted the CT from that hospitalization, and prostate measures 40 g at that time.  He denies any urinary symptoms, UTIs, or stone events since her last visit.  Voiding with a strong  stream, PVR today normal at 45ml.  RTC 6 months lab visit PSA reflex to free:  if PSA >10 would again recommend repeat prostate MRI   Sondra Come, MD  [facility] [address] [place], [place] [address] [phone]

## 2023-08-01 ENCOUNTER — Other Ambulatory Visit: Payer: Self-pay

## 2023-08-01 DIAGNOSIS — C61 Malignant neoplasm of prostate: Secondary | ICD-10-CM

## 2023-08-02 ENCOUNTER — Ambulatory Visit: Payer: Self-pay | Admitting: Urology

## 2023-08-02 LAB — PSA TOTAL (REFLEX TO FREE): Prostate Specific Ag, Serum: 10.8 ng/mL — ABNORMAL HIGH (ref 0.0–4.0)

## 2023-08-02 NOTE — Telephone Encounter (Signed)
-----   Message from Redell JAYSON Burnet sent at 08/02/2023  8:19 AM EDT ----- PSA remains elevated at 10.8.  I would recommend a prostate MRI like we discussed in clinic to evaluate for any change in your slow-growing prostate cancer.  Follow-up in person to review those  results.  Redell Burnet, MD 08/02/2023  ----- Message ----- From: Interface, [facility] Lab Results In Sent: 08/02/2023   5:36 AM EDT To: Redell JAYSON Burnet, MD

## 2023-08-02 NOTE — Telephone Encounter (Signed)
Called pt no answer. 1st attempt.
